# Patient Record
Sex: Female | Born: 1980 | Race: White | Hispanic: No | Marital: Married | Smoking: Current every day smoker
Health system: Southern US, Community
[De-identification: ages and names within clinical notes are randomized; demographics above are authoritative.]

## PROBLEM LIST (undated history)

## (undated) DIAGNOSIS — I10 Essential (primary) hypertension: Secondary | ICD-10-CM

## (undated) HISTORY — PX: WISDOM TOOTH EXTRACTION: SHX21

## (undated) HISTORY — PX: MANDIBLE FRACTURE SURGERY: SHX706

---
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
## 1999-04-20 ENCOUNTER — Other Ambulatory Visit: Admission: RE | Admit: 1999-04-20 | Discharge: 1999-04-20 | Payer: Self-pay | Admitting: *Deleted

## 2003-03-06 ENCOUNTER — Other Ambulatory Visit: Admission: RE | Admit: 2003-03-06 | Discharge: 2003-03-06 | Payer: Self-pay | Admitting: Family Medicine

## 2004-03-13 ENCOUNTER — Other Ambulatory Visit: Admission: RE | Admit: 2004-03-13 | Discharge: 2004-03-13 | Payer: Self-pay | Admitting: Family Medicine

## 2005-04-09 ENCOUNTER — Other Ambulatory Visit: Admission: RE | Admit: 2005-04-09 | Discharge: 2005-04-09 | Payer: Self-pay | Admitting: Family Medicine

## 2006-04-11 ENCOUNTER — Other Ambulatory Visit: Admission: RE | Admit: 2006-04-11 | Discharge: 2006-04-11 | Payer: Self-pay | Admitting: Family Medicine

## 2008-06-07 ENCOUNTER — Inpatient Hospital Stay (HOSPITAL_COMMUNITY): Admission: AD | Admit: 2008-06-07 | Discharge: 2008-06-10 | Payer: Self-pay | Admitting: Obstetrics and Gynecology

## 2008-06-08 ENCOUNTER — Encounter (INDEPENDENT_AMBULATORY_CARE_PROVIDER_SITE_OTHER): Payer: Self-pay | Admitting: Obstetrics and Gynecology

## 2008-11-25 ENCOUNTER — Ambulatory Visit (HOSPITAL_COMMUNITY): Admission: RE | Admit: 2008-11-25 | Discharge: 2008-11-25 | Payer: Self-pay | Admitting: Surgery

## 2010-05-07 LAB — APTT: aPTT: 30 seconds (ref 24–37)

## 2010-05-07 LAB — BASIC METABOLIC PANEL
BUN: 8 mg/dL (ref 6–23)
CO2: 22 mEq/L (ref 19–32)
Calcium: 9 mg/dL (ref 8.4–10.5)
Chloride: 106 mEq/L (ref 96–112)
Creatinine, Ser: 0.66 mg/dL (ref 0.4–1.2)
GFR calc Af Amer: >60 mL/min (ref >60)
Sodium: 137 mEq/L (ref 135–145)

## 2010-05-07 LAB — PROTIME-INR
INR: 0.89 (ref 0.00–1.49)
Prothrombin Time: 12 seconds (ref 11.6–15.2)

## 2010-05-07 LAB — CBC
Platelets: 338 10*3/uL (ref 150–400)
RBC: 4.22 MIL/uL (ref 3.87–5.11)
RDW: 13.4 % (ref 11.5–15.5)
WBC: 9.1 10*3/uL (ref 4.0–10.5)

## 2010-05-12 LAB — CBC
HCT: 30.1 % — ABNORMAL LOW (ref 36.0–46.0)
Hemoglobin: 10.7 g/dL — ABNORMAL LOW (ref 12.0–15.0)
MCV: 95.2 fL (ref 78.0–100.0)
Platelets: 266 10*3/uL (ref 150–400)
Platelets: 314 10*3/uL (ref 150–400)
RBC: 2.74 MIL/uL — ABNORMAL LOW (ref 3.87–5.11)
WBC: 15.1 10*3/uL — ABNORMAL HIGH (ref 4.0–10.5)
WBC: 20.3 10*3/uL — ABNORMAL HIGH (ref 4.0–10.5)

## 2010-05-12 LAB — CCBB MATERNAL DONOR DRAW

## 2010-08-05 IMAGING — XA IR ANGIO/EXT/UNI*R*
1 series · 16 of 24 positions shown · non-contrast
Comparison: none

CLINICAL DATA: 28-year-old female right thumb, index finger and
third finger ischemic changes, parasthesias and pain concerning for
ring knots phenomenon versus vasculitis or vascular disease.

[Series 1: run · 16 of 51 slices shown]
[im 1/51]
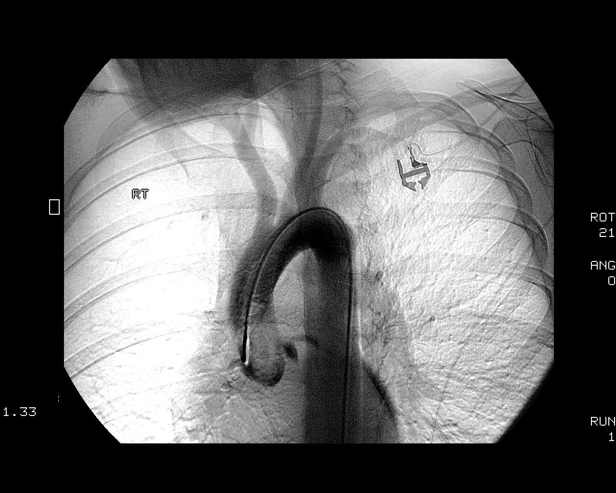
[im 5/51]
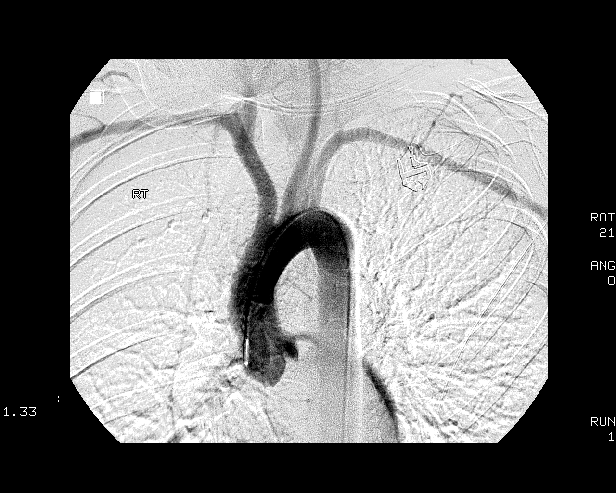
[im 7/51]
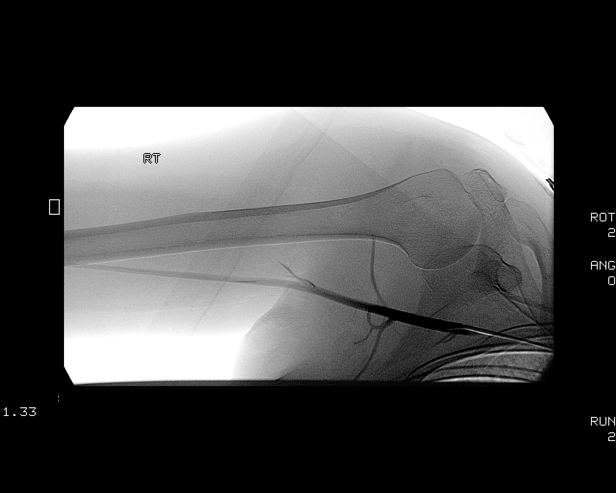
[im 11/51]
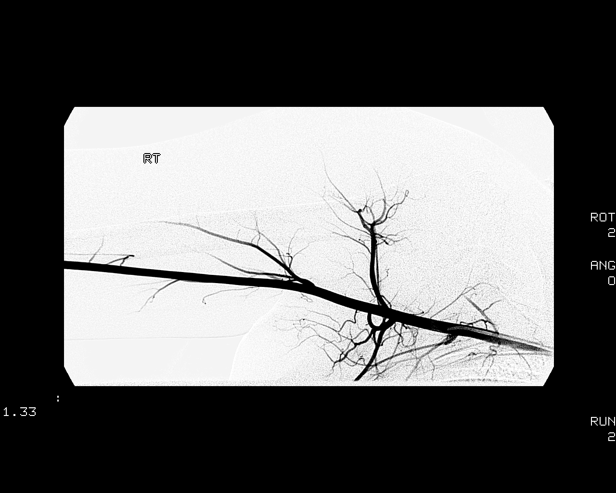
[im 14/51]
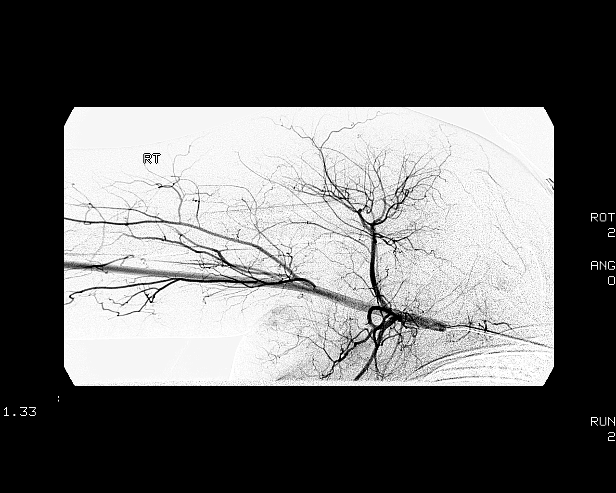
[im 18/51]
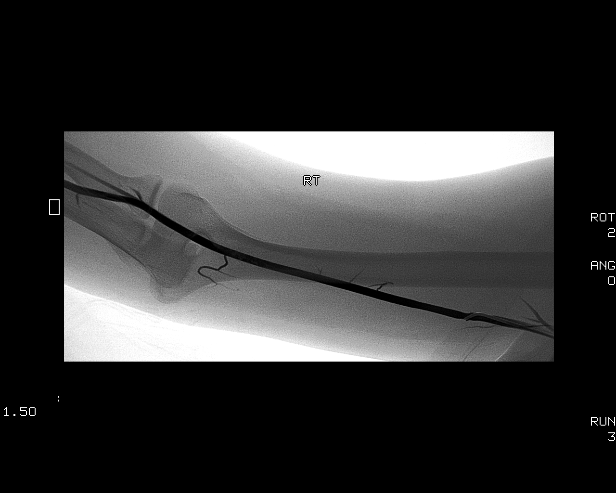
[im 20/51]
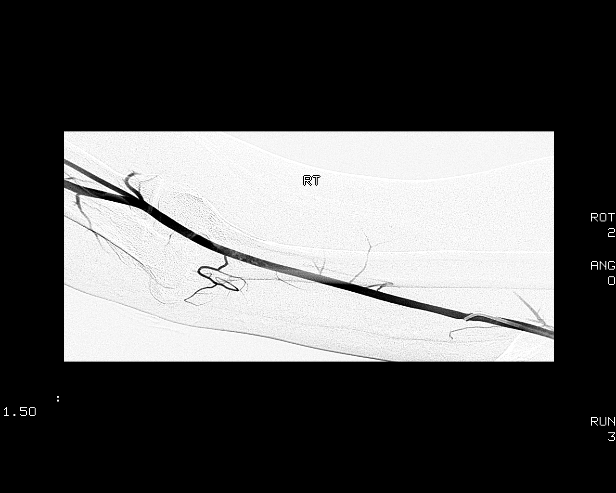
[im 24/51]
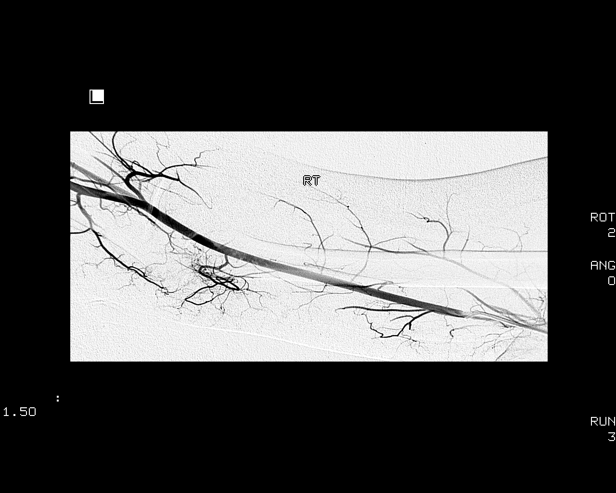
[im 27/51]
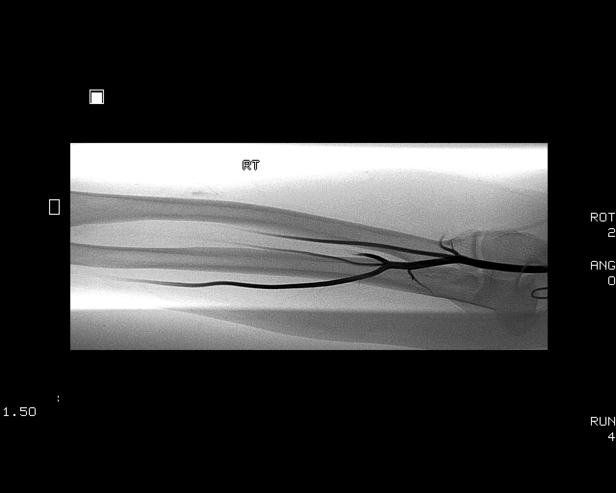
[im 31/51]
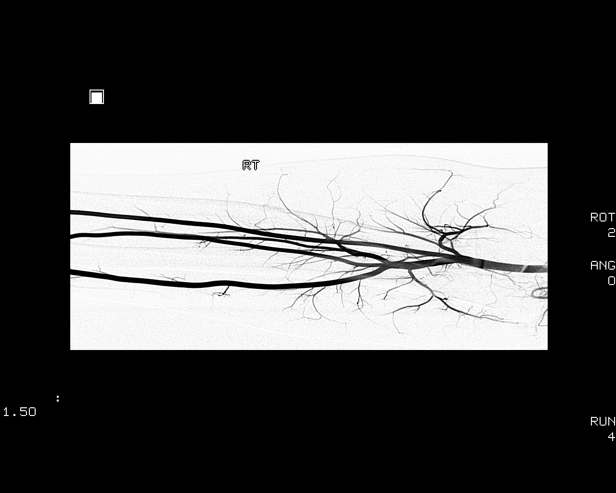
[im 33/51]
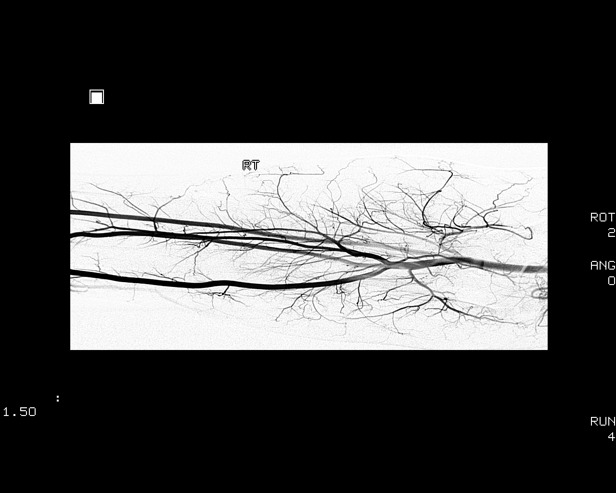
[im 37/51]
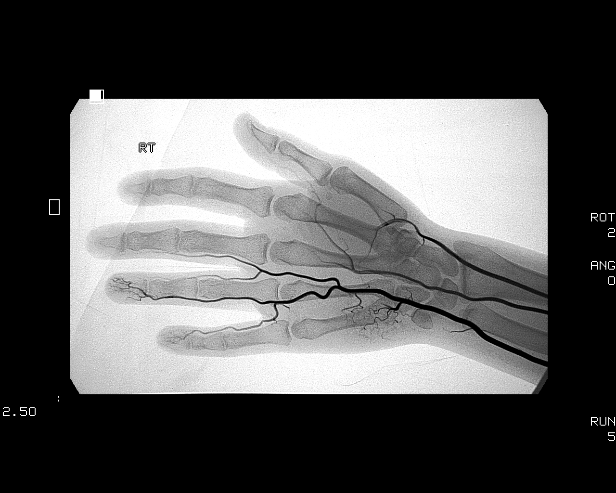
[im 40/51]
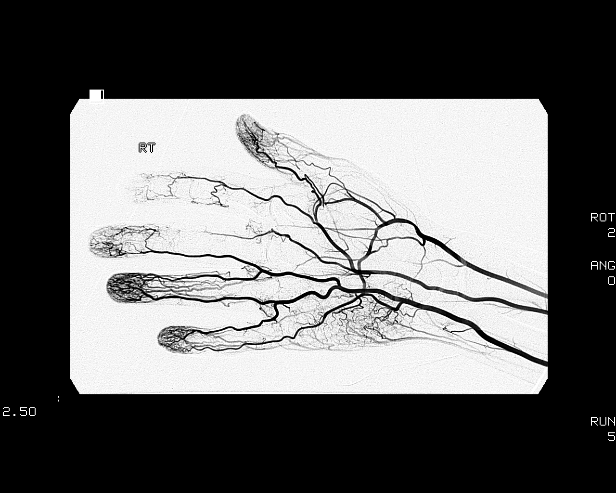
[im 44/51]
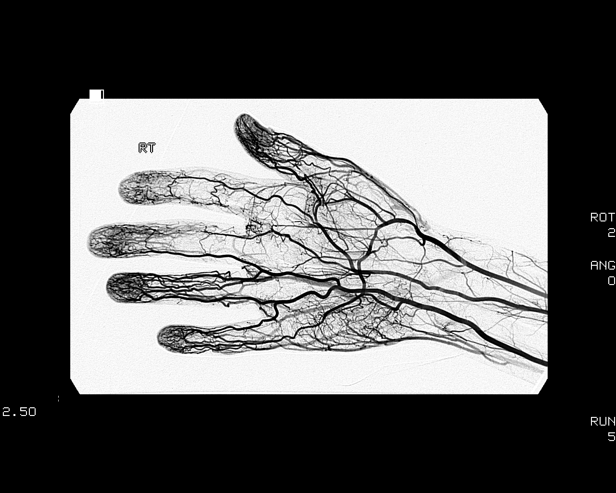
[im 46/51]
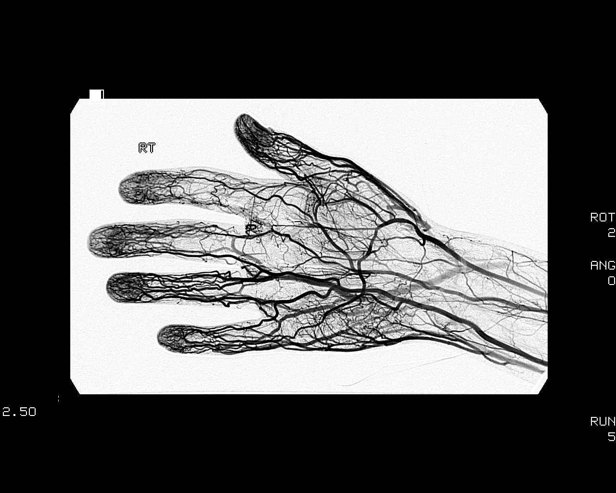
[im 51/51]
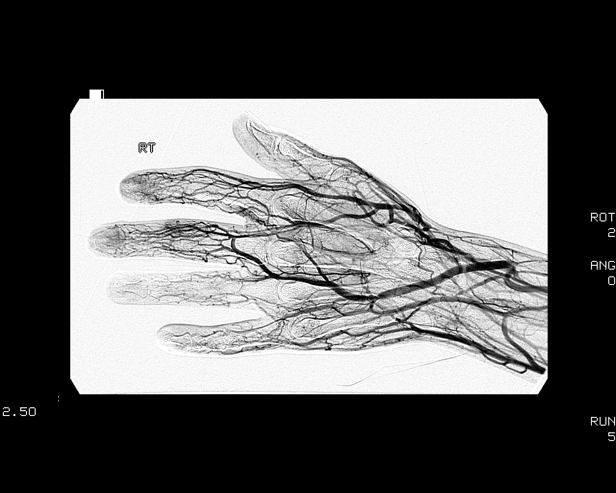

[16 of 24 positions shown; findings below may reference images not displayed]

ULTRASOUND GUIDANCE FOR VASCULAR ACCESS
CERVICOTHORACIC AORTIC ARCH
RIGHT UPPER EXTREMITY ANGIOGRAM

Radiologist:  Xherfi Salihasi, M.D.

Medications:  3.5 mg Versed, 100 mcg Fentanyl

Guidance:  Ultrasound and fluoroscopic

Fluoroscopy time:  4.4 minutes

Sedation time:  25 minutes

Contrast volume:  85 ml Visipaque 300

Complications:  No immediate

PROCEDURE/FINDINGS:

Informed consent was obtained from the patient following
explanation of the procedure, risks, benefits and alternatives.
The patient understands, agrees and consents for the procedure.
All questions were addressed.  A time out was performed.

Maximal barrier sterile technique utilized including caps, mask,
sterile gowns, sterile gloves, large sterile drape, hand hygiene,
and betadine.

Under sterile conditions and local anesthesia, right common femoral
artery access was performed with ultrasound.  Over a guide wire, a
5-French sheath was inserted.  Initially, a 5-French pigtail
catheter was advanced into the ascending aortic arch.  Contrast
injection was performed for a cervicothoracic aortogram.

Aortogram:  Thoracic aorta is normal in caliber.  No evidence of
atherosclerosis or wall irregularity.  No focal stenosis or source
of emboli identified.  The brachiocephalic, left common carotid,
and left subclavian arteries are all patent off of the aorta.

The pigtail catheter was exchanged for a 5-French catheter.  This
was advanced over a glide wire into the right axillary artery.
Right upper arm angiogram was performed.  For better visualization
of the peripheral small vessels, the catheter was advanced more
peripherally into the right brachial artery.  Additional image was
performed of the right upper extremity to include all five digits.

Right upper arm angiogram:  The right subclavian, axillary,
brachial, radial, interosseous, and ulnar arteries are all normal.
At the wrist, the radius, interosseous and ulnar arteries remain
patent supplying the superficial and deep palmar arches. The common
digital branches are patent to all five fingers.  However, there is
mild narrowing without occlusion of the common digital branches to
the right second and third digits.  The proper digital arteries to
the thumb, fourth and fifth fingers appear normal.  The right
second and third digits only have one proper digital artery each
instead of the normal two.   Between the right second and third
fingers, the common digital artery in this region is small in
caliber and it's branching proper digital arteries to the right
second and third fingers taper to chronic-appearing occlusions.
This results in slight delayed vascular flow to the right second
and third digit distal phalangeal vascular beds.  No evidence of
acute embolic disease.  There is normal venous drainage with
delayed imaging.

The vascular abnormalities described are nonspecific but can be
seen with small vessel digital arterial disease related to
connective tissue disorder or collagen vascular disease.  Other
less likely considerations would include mild early vasculitis or
Jim disease, however these two etiologies usually involve
more proximal larger vessels.
IMPRESSION: Mild narrowing without occlusion of the common digital branches to
the right second and third digits with only one patent proper
digital artery to the second and third digits.  The other proper
digital arteries to the second and third digits taper to chronic
occlusions as described.  Appearance is most consistent with
connective tissue disorder or collagen vascular disease.

## 2010-08-10 ENCOUNTER — Other Ambulatory Visit: Payer: Self-pay | Admitting: Obstetrics and Gynecology

## 2015-04-03 ENCOUNTER — Ambulatory Visit (INDEPENDENT_AMBULATORY_CARE_PROVIDER_SITE_OTHER): Payer: BLUE CROSS/BLUE SHIELD | Admitting: Physician Assistant

## 2015-04-03 VITALS — BP 114/84 | HR 105 | Temp 98.4°F | Resp 18 | Ht 63.0 in | Wt 164.0 lb

## 2015-04-03 DIAGNOSIS — Z720 Tobacco use: Secondary | ICD-10-CM

## 2015-04-03 DIAGNOSIS — F17200 Nicotine dependence, unspecified, uncomplicated: Secondary | ICD-10-CM

## 2015-04-03 DIAGNOSIS — J069 Acute upper respiratory infection, unspecified: Secondary | ICD-10-CM | POA: Diagnosis not present

## 2015-04-03 DIAGNOSIS — B9789 Other viral agents as the cause of diseases classified elsewhere: Principal | ICD-10-CM

## 2015-04-03 MED ORDER — PREDNISONE 20 MG PO TABS: ORAL_TABLET | ORAL | Status: DC

## 2015-04-03 MED ORDER — IPRATROPIUM BROMIDE 0.03 % NA SOLN: 2.0000 | Freq: Two times a day (BID) | NASAL | Status: DC

## 2015-04-03 MED ORDER — VARENICLINE TARTRATE 1 MG PO TABS: 1.0000 mg | ORAL_TABLET | Freq: Two times a day (BID) | ORAL | Status: DC

## 2015-04-03 MED ORDER — BENZONATATE 100 MG PO CAPS: 100.0000 mg | ORAL_CAPSULE | Freq: Three times a day (TID) | ORAL | Status: DC | PRN

## 2015-04-03 MED ORDER — VARENICLINE TARTRATE 0.5 MG X 11 & 1 MG X 42 PO MISC: ORAL | Status: DC

## 2015-04-03 NOTE — Patient Instructions (Addendum)
Upper Respiratory Infection, Adult Most upper respiratory infections (URIs) are a viral infection of the air passages leading to the lungs. A URI affects the nose, throat, and upper air passages. The most common type of URI is nasopharyngitis and is typically referred to as "the common cold." URIs run their course and usually go away on their own. Most of the time, a URI does not require medical attention, but sometimes a bacterial infection in the upper airways can follow a viral infection. This is called a secondary infection. Sinus and middle ear infections are common types of secondary upper respiratory infections. Bacterial pneumonia can also complicate a URI. A URI can worsen asthma and chronic obstructive pulmonary disease (COPD). Sometimes, these complications can require emergency medical care and may be life threatening.  CAUSES Almost all URIs are caused by viruses. A virus is a type of germ and can spread from one person to another.  RISKS FACTORS You may be at risk for a URI if:   You smoke.   You have chronic heart or lung disease.  You have a weakened defense (immune) system.   You are very young or very old.   You have nasal allergies or asthma.  You work in crowded or poorly ventilated areas.  You work in health care facilities or schools. SIGNS AND SYMPTOMS  Symptoms typically develop 2-3 days after you come in contact with a cold virus. Most viral URIs last 7-10 days. However, viral URIs from the influenza virus (flu virus) can last 14-18 days and are typically more severe. Symptoms may include:   Runny or stuffy (congested) nose.   Sneezing.   Cough.   Sore throat.   Headache.   Fatigue.   Fever.   Loss of appetite.   Pain in your forehead, behind your eyes, and over your cheekbones (sinus pain).  Muscle aches.  DIAGNOSIS  Your health care provider may diagnose a URI by:  Physical exam.  Tests to check that your symptoms are not due to  another condition such as:  Strep throat.  Sinusitis.  Pneumonia.  Asthma. TREATMENT  A URI goes away on its own with time. It cannot be cured with medicines, but medicines may be prescribed or recommended to relieve symptoms. Medicines may help:  Reduce your fever.  Reduce your cough.  Relieve nasal congestion. HOME CARE INSTRUCTIONS   Take medicines only as directed by your health care provider.   Gargle warm saltwater or take cough drops to comfort your throat as directed by your health care provider.  Use a warm mist humidifier or inhale steam from a shower to increase air moisture. This may make it easier to breathe.  Drink enough fluid to keep your urine clear or pale yellow.   Eat soups and other clear broths and maintain good nutrition.   Rest as needed.   Return to work when your temperature has returned to normal or as your health care provider advises. You may need to stay home longer to avoid infecting others. You can also use a face mask and careful hand washing to prevent spread of the virus.  Increase the usage of your inhaler if you have asthma.   Do not use any tobacco products, including cigarettes, chewing tobacco, or electronic cigarettes. If you need help quitting, ask your health care provider. PREVENTION  The best way to protect yourself from getting a cold is to practice good hygiene.   Avoid oral or hand contact with people with cold   symptoms.   Wash your hands often if contact occurs.  There is no clear evidence that vitamin C, vitamin E, echinacea, or exercise reduces the chance of developing a cold. However, it is always recommended to get plenty of rest, exercise, and practice good nutrition.  SEEK MEDICAL CARE IF:   You are getting worse rather than better.   Your symptoms are not controlled by medicine.   You have chills.  You have worsening shortness of breath.  You have brown or red mucus.  You have yellow or brown nasal  discharge.  You have pain in your face, especially when you bend forward.  You have a fever.  You have swollen neck glands.  You have pain while swallowing.  You have white areas in the back of your throat. SEEK IMMEDIATE MEDICAL CARE IF:   You have severe or persistent:  Headache.  Ear pain.  Sinus pain.  Chest pain.  You have chronic lung disease and any of the following:  Wheezing.  Prolonged cough.  Coughing up blood.  A change in your usual mucus.  You have a stiff neck.  You have changes in your:  Vision.  Hearing.  Thinking.  Mood. MAKE SURE YOU:   Understand these instructions.  Will watch your condition.  Will get help right away if you are not doing well or get worse.   This information is not intended to replace advice given to you by your health care provider. Make sure you discuss any questions you have with your health care provider.   Document Released: 07/14/2000 Document Revised: 06/04/2014 Document Reviewed: 04/25/2013 Elsevier Interactive Patient Education [employer].  Did you know that you begin to benefit from quitting smoking within the first twenty minutes? It's TRUE.  At 20 minutes: -blood pressure decreases -pulse rate drops -body temperature of hands and feet increases  At 8 hours: -carbon monoxide level in blood drops to normal -oxygen level in blood increases to normal  At 24 hours: -the chance of heart attack decreases  At 48 hours: -nerve endings start regrowing -ability to smell and taste is enhanced  2 weeks-3 months: -circulation improves -walking becomes easier -lung function improves  1-9 months: -coughing, sinus congestion, fatigue and shortness of breath decreases  1 year: -excess risk of heart disease is decreased to HALF that of a smoker  5 years: Stroke risk is reduced to that of people who have never smoked  10 years: -risk of lung cancer drops to as little as half that of  continuing smokers -risk of cancer of the mouth, throat, esophagus, bladder, kidney and pancreas decreases -risk of ulcer decreases  15 years -risk of heart disease is now similar to that of people who have never smoked -risk of death returns to nearly the level of people who have never smoked

## 2015-04-03 NOTE — Progress Notes (Signed)
Subjective:   Patient ID: Summer King, female     DOB: 09-18-1980, 35 y.o.    MRN: [id number]  PCP: No primary care provider on file.  Chief Complaint  Patient presents with  . Sore Throat    x 2 days  . Nasal Congestion  . Cough    HPI  Presents for evaluation of cough. "I can't stop coughing."  Began with a sore throat 2 days ago. Yesterday developed a cough. Not productive, but coughs to the point of gagging. No better with OTC cough preparation. Last night developed nasal congestion, neck soreness, ear pain. Does NOT feel like strep she's had previously. No fever, chills. No GU, nausea, diarrhea. No muscle/joint aches.  Prior to Admission medications   Medication Sig Start Date End Date Taking? Authorizing Provider  levonorgestrel (MIRENA) 20 MCG/24HR IUD 1 each by Intrauterine route once.   Yes Historical Provider, MD     Allergies  Allergen Reactions  . Codeine Hives  . Hydrocodone Hives  . Penicillins      There are no active problems to display for this patient.    Family History  Problem Relation Age of Onset  . Heart disease Father   . Arthritis Mother     rheumatoid     Social History   Social History  . Marital Status: Married    Spouse Name: N/A  . Number of Children: 1  . Years of Education: 12+   Occupational History  . computer/office work    Social History Main Topics  . Smoking status: Current Every Day Smoker  . Smokeless tobacco: Never Used     Comment: contemplating, interested in Chantix (04/2015)  . Alcohol Use: 1.2 - 1.8 oz/week    2-3 Standard drinks or equivalent per week  . Drug Use: No  . Sexual Activity: Not on file   Other Topics Concern  . Not on file   Social History Narrative   Lives with her husband and son.        Review of Systems  Constitutional: Positive for fatigue. Negative for fever, chills, activity change, appetite change and unexpected weight change.  HENT: Positive for congestion,  ear pain, postnasal drip, rhinorrhea, sinus pressure, sore throat and voice change. Negative for dental problem, facial swelling, hearing loss, mouth sores, sneezing, tinnitus and trouble swallowing.   Eyes: Negative for photophobia, pain, redness and visual disturbance.  Respiratory: Positive for cough. Negative for chest tightness, shortness of breath and wheezing.   Cardiovascular: Negative for chest pain, palpitations and leg swelling.  Gastrointestinal: Positive for vomiting (with hard coughing). Negative for nausea, abdominal pain, diarrhea, constipation and blood in stool.  Genitourinary: Negative for dysuria, urgency, frequency and hematuria.  Musculoskeletal: Negative for myalgias, arthralgias, gait problem and neck stiffness.  Skin: Negative for rash.  Neurological: Negative for dizziness, speech difficulty, weakness, light-headedness, numbness and headaches.  Hematological: Negative for adenopathy.  Psychiatric/Behavioral: Negative for confusion and sleep disturbance. The patient is not nervous/anxious.          Objective:  Physical Exam  Constitutional: She is oriented to person, place, and time. She appears well-developed and well-nourished. She is active and cooperative. No distress.  BP 114/84 mmHg  Pulse 105  Temp(Src) 98.4 F (36.9 C)  Resp 18  Ht 5\' 3"  (1.6 m)  Wt 164 lb (74.39 kg)  BMI 29.06 kg/m2  SpO2 98%   HENT:  Head: Normocephalic and atraumatic.  Right Ear: Hearing, tympanic membrane, external ear and ear  canal normal.  Left Ear: Hearing, tympanic membrane, external ear and ear canal normal.  Nose: Mucosal edema present. No rhinorrhea.  No foreign bodies. Right sinus exhibits no maxillary sinus tenderness and no frontal sinus tenderness. Left sinus exhibits no maxillary sinus tenderness and no frontal sinus tenderness.  Mouth/Throat: Uvula is midline, oropharynx is clear and moist and mucous membranes are normal. No uvula swelling. No oropharyngeal exudate.    Eyes: Conjunctivae and EOM are normal. Pupils are equal, round, and reactive to light. Right eye exhibits no discharge. Left eye exhibits no discharge. No scleral icterus.  Neck: Trachea normal, normal range of motion and full passive range of motion without pain. Neck supple. No thyroid mass and no thyromegaly present.  Cardiovascular: Normal rate, regular rhythm and normal heart sounds.   Pulmonary/Chest: Effort normal and breath sounds normal.  Lymphadenopathy:       Head (right side): No submandibular, no tonsillar, no preauricular, no posterior auricular and no occipital adenopathy present.       Head (left side): No submandibular, no tonsillar, no preauricular and no occipital adenopathy present.    She has no cervical adenopathy.       Right: No supraclavicular adenopathy present.       Left: No supraclavicular adenopathy present.  Neurological: She is alert and oriented to person, place, and time. She has normal strength. No cranial nerve deficit or sensory deficit.  Skin: Skin is warm, dry and intact. No rash noted.  Psychiatric: She has a normal mood and affect. Her speech is normal and behavior is normal.             Assessment & Plan:  1. Viral URI with cough Unable to tolerate codeine/hydrocodone due to itching/hives. No wheezing. Prednisone. Atrovent NS. Tessalon. Rest. Fluids.  - predniSONE (DELTASONE) 20 MG tablet; Take 3 PO QAM x3days, 2 PO QAM x3days, 1 PO QAM x3days  Dispense: 18 tablet; Refill: 0 - ipratropium (ATROVENT) 0.03 % nasal spray; Place 2 sprays into both nostrils 2 (two) times daily.  Dispense: 30 mL; Refill: 0 - benzonatate (TESSALON) 100 MG capsule; Take 1-2 capsules (100-200 mg total) by mouth 3 (three) times daily as needed for cough.  Dispense: 40 capsule; Refill: 0  2. Smoker Trial of Chantix. Anticipatory guidance. - varenicline (CHANTIX) 1 MG tablet; Take 1 tablet (1 mg total) by mouth 2 (two) times daily. To begin: 1/2 tablet once daily x 4  days, then 1/2 tablet twice daily x 4 days, then 1 tablet each evening and 1/2 tablet each morning x 4 days, then 1 tablet twice daily.  Dispense: 60 tablet; Refill: 5   Fara Chute, PA-C Physician Assistant-Certified [facility]

## 2017-02-04 ENCOUNTER — Encounter: Payer: Self-pay | Admitting: Oncology

## 2021-07-01 ENCOUNTER — Telehealth: Payer: Self-pay | Admitting: Hematology and Oncology

## 2021-07-01 NOTE — Telephone Encounter (Signed)
Scheduled appt per 5/31 referral. Earlier appt time was offered to pt, she was unable to make day/time that was originally offered. Pt is aware of appt date and time. Pt is aware to arrive 15 mins prior to appt time and to bring and updated insurance card. Pt is aware of appt location.

## 2021-07-17 ENCOUNTER — Encounter: Payer: Self-pay | Admitting: Hematology and Oncology

## 2021-07-17 ENCOUNTER — Inpatient Hospital Stay: Payer: 59

## 2021-07-17 ENCOUNTER — Other Ambulatory Visit: Payer: Self-pay

## 2021-07-17 ENCOUNTER — Inpatient Hospital Stay: Payer: 59 | Attending: Hematology and Oncology | Admitting: Hematology and Oncology

## 2021-07-17 VITALS — BP 164/120 | HR 100 | Resp 18

## 2021-07-17 DIAGNOSIS — R030 Elevated blood-pressure reading, without diagnosis of hypertension: Secondary | ICD-10-CM | POA: Diagnosis not present

## 2021-07-17 DIAGNOSIS — D751 Secondary polycythemia: Secondary | ICD-10-CM

## 2021-07-17 DIAGNOSIS — F17210 Nicotine dependence, cigarettes, uncomplicated: Secondary | ICD-10-CM | POA: Insufficient documentation

## 2021-07-17 DIAGNOSIS — Z720 Tobacco use: Secondary | ICD-10-CM

## 2021-07-17 DIAGNOSIS — E860 Dehydration: Secondary | ICD-10-CM | POA: Diagnosis not present

## 2021-07-17 NOTE — Progress Notes (Signed)
Summer King presents today for phlebotomy per MD orders. Phlebotomy procedure started at 1400 and ended at 1412. 500 grams removed via 16 G needle to [place]. IV needle removed intact. Patient observed for 30 minutes after procedure without any incident. Patient provided with food and drink following procedure.  Patient tolerated procedure well. Patient BP still elevated at discharge. Dr. Alvy Bimler made aware. Per MD - okay to discharge at this time. Patient endorses that she feels fine. No symptoms of headache, blurred vision, chest pain, or shortness of breath at this time.

## 2021-07-17 NOTE — Patient Instructions (Signed)

## 2021-07-18 ENCOUNTER — Encounter: Payer: Self-pay | Admitting: Hematology and Oncology

## 2021-07-18 DIAGNOSIS — E860 Dehydration: Secondary | ICD-10-CM | POA: Insufficient documentation

## 2021-07-18 DIAGNOSIS — Z720 Tobacco use: Secondary | ICD-10-CM | POA: Insufficient documentation

## 2021-07-18 DIAGNOSIS — R030 Elevated blood-pressure reading, without diagnosis of hypertension: Secondary | ICD-10-CM | POA: Insufficient documentation

## 2021-07-18 NOTE — Progress Notes (Signed)
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 I recommend phlebotomy immediately due to high risk of thrombosis The patient is concerned about cost of treatment She is not willing to come back due to concern for cost Ultimately, she agreed for 1 unit of blood to be removed today I recommend the patient to go and donate blood 3 more times every 2 to 3 weeks to get her hemoglobin down to target goal of around 16 She would need to quit smoking as well as this is caused by her smoking Due to concern about medical costs, she would get her CBC rechecked with her primary care doctor in a few months for further follow-up I also recommend aspirin daily to prevent risk of blood clots  Dehydration She is clinically dehydrated We discussed importance of drinking at least 64 ounces of fluid a day She will try her best  Tobacco abuse She was given information by her referring physician about smoking cessation She is motivated to quit We discussed importance of her quitting due to high risk of thrombosis  Elevated BP without diagnosis of hypertension Her high blood pressure is likely due to high viscosity due to polycythemia It is likely that her blood pressure will improve back to normal with for phlebotomy/donation sessions I recommend the patient to continue follow-up with her primary care doctor in regards to chronic blood pressure monitoring  The total time spent in the appointment was 55 minutes encounter with patients including review of chart and various tests results, discussions about plan of care and coordination of care plan   All questions were answered. The patient knows to call the clinic with any problems, questions or concerns. No barriers to learning was detected.  Heath Lark,  MD 6/17/202310:59 AM  CHIEF COMPLAINTS/PURPOSE OF CONSULTATION:  Erythrocytosis  HISTORY OF PRESENTING ILLNESS:  Summer King 41 y.o. female is here because of elevated hemoglobin.  She was found to have abnormal CBC from recent blood draw. Her baseline hemoglobin/CBC on November 25, 2008 was normal at 13.5 Most recently, she went to her gynecologist for evaluation and routine Pap smear Most recently, on Jun 30, 2021, repeat hemoglobin came back high at 20.1 with hematocrit of 60.1.  This was repeated and confirmed She denies intermittent headaches, shortness of breath on exertion, frequent leg cramps and occasional chest pain.  She never suffer from diagnosis of blood clot.  There is no prior diagnosis of obstructive sleep apnea. The patient denies weight loss or skin itching. The patient is a smoker and currently smokes 1 pack of cigarettes per day for the last 20 years. She also admits that she is not drinking enough liquid She drinks somewhere around 2 to 3 cups of water per day  MEDICAL HISTORY:  History reviewed. No pertinent past medical history.  SURGICAL HISTORY: Past Surgical History:  Procedure Laterality Date   MANDIBLE FRACTURE SURGERY  4th grade    SOCIAL HISTORY: Social History   Socioeconomic History   Marital status: Married    Spouse name: Not on file   Number of children: 1   Years of education: 12+   Highest education level: Not on file  Occupational History   Occupation: computer/office work  Tobacco Use   Smoking status: Every Day   Smokeless tobacco: Never   Tobacco comments:    contemplating,  interested in Chantix (04/2015)  Substance and Sexual Activity   Alcohol use: Yes    Alcohol/week: 2.0 - 3.0 standard drinks of alcohol    Types: 2 - 3 Standard drinks or equivalent per week   Drug use: No   Sexual activity: Not on file  Other Topics Concern   Not on file  Social History Narrative   Lives with her husband and son.   Social  Determinants of Health   Financial Resource Strain: Not on file  Food Insecurity: Not on file  Transportation Needs: Not on file  Physical Activity: Not on file  Stress: Not on file  Social Connections: Not on file  Intimate Partner Violence: Not on file    FAMILY HISTORY: Family History  Problem Relation Age of Onset   Heart disease Father    Arthritis Mother        rheumatoid    ALLERGIES:  is allergic to codeine, hydrocodone, and penicillins.  MEDICATIONS:  Current Outpatient Medications  Medication Sig Dispense Refill   levonorgestrel (MIRENA) 20 MCG/24HR IUD 1 each by Intrauterine route once.     No current facility-administered medications for this visit.    REVIEW OF SYSTEMS:   Constitutional: Denies fevers, chills or abnormal night sweats Eyes: Denies blurriness of vision, double vision or watery eyes Ears, nose, mouth, throat, and face: Denies mucositis or sore throat Respiratory: Denies cough, dyspnea or wheezes Cardiovascular: Denies palpitation, chest discomfort or lower extremity swelling Gastrointestinal:  Denies nausea, heartburn or change in bowel habits Skin: Denies abnormal skin rashes Lymphatics: Denies new lymphadenopathy or easy bruising Neurological:Denies numbness, tingling or new weaknesses Behavioral/Psych: Mood is stable, no new changes  All other systems were reviewed with the patient and are negative.  PHYSICAL EXAMINATION: ECOG PERFORMANCE STATUS: 1 - Symptomatic but completely ambulatory  Vitals:   07/17/21 1319  BP: (!) 164/111  Pulse: 84  Resp: 18  Temp: 98.9 F (37.2 C)  SpO2: 96%   Filed Weights   07/17/21 1319  Weight: 165 lb (74.8 kg)    GENERAL:alert, no distress and comfortable.  She looks plethoric SKIN: skin color, texture, turgor are normal, no rashes or significant lesions EYES: normal, conjunctiva are pink and non-injected, sclera clear OROPHARYNX:no exudate, no erythema and lips, buccal mucosa, and tongue normal   NECK: supple, thyroid normal size, non-tender, without nodularity LYMPH:  no palpable lymphadenopathy in the cervical, axillary or inguinal LUNGS: clear to auscultation and percussion with normal breathing effort HEART: regular rate & rhythm and no murmurs and no lower extremity edema ABDOMEN:abdomen soft, non-tender and normal bowel sounds Musculoskeletal:no cyanosis of digits and no clubbing  PSYCH: alert & oriented x 3 with fluent speech NEURO: no focal motor/sensory deficits  LABORATORY DATA:  I have reviewed the data as listed in referral packet

## 2021-07-18 NOTE — Assessment & Plan Note (Signed)
She was given information by her referring physician about smoking cessation She is motivated to quit We discussed importance of her quitting due to high risk of thrombosis

## 2021-07-18 NOTE — Assessment & Plan Note (Signed)
Her high blood pressure is likely due to high viscosity due to polycythemia It is likely that her blood pressure will improve back to normal with for phlebotomy/donation sessions I recommend the patient to continue follow-up with her primary care doctor in regards to chronic blood pressure monitoring 

## 2021-07-18 NOTE — Assessment & Plan Note (Addendum)
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 I recommend phlebotomy immediately due to high risk of thrombosis The patient is concerned about cost of treatment She is not willing to come back due to concern for cost Ultimately, she agreed for 1 unit of blood to be removed today I recommend the patient to go and donate blood 3 more times every 2 to 3 weeks to get her hemoglobin down to target goal of around 16 She would need to quit smoking as well as this is caused by her smoking Due to concern about medical costs, she would get her CBC rechecked with her primary care doctor in a few months for further follow-up I also recommend aspirin daily to prevent risk of blood clots

## 2021-07-18 NOTE — Assessment & Plan Note (Signed)
She is clinically dehydrated We discussed importance of drinking at least 64 ounces of fluid a day She will try her best

## 2021-08-25 ENCOUNTER — Telehealth: Payer: Self-pay

## 2021-08-25 ENCOUNTER — Other Ambulatory Visit: Payer: Self-pay | Admitting: Hematology and Oncology

## 2021-08-25 NOTE — Telephone Encounter (Signed)
I will send LOS 

## 2021-08-25 NOTE — Telephone Encounter (Signed)
Returned her call. She is requesting appt for labs and phlebotomy. [facility] and was told her HGB was too high. Today she went to [facility] and was told her bp is too high.

## 2021-08-27 ENCOUNTER — Telehealth: Payer: Self-pay | Admitting: Hematology and Oncology

## 2021-08-27 NOTE — Telephone Encounter (Signed)
.  Called patient to schedule appointment per 7/26 inbasket, patient is aware of date and time.   

## 2021-08-28 ENCOUNTER — Encounter: Payer: Self-pay | Admitting: Hematology and Oncology

## 2021-08-28 ENCOUNTER — Inpatient Hospital Stay: Payer: 59

## 2021-08-28 ENCOUNTER — Other Ambulatory Visit: Payer: Self-pay | Admitting: Hematology and Oncology

## 2021-08-28 ENCOUNTER — Inpatient Hospital Stay: Payer: 59 | Attending: Hematology and Oncology | Admitting: Hematology and Oncology

## 2021-08-28 ENCOUNTER — Other Ambulatory Visit: Payer: Self-pay

## 2021-08-28 DIAGNOSIS — D751 Secondary polycythemia: Secondary | ICD-10-CM

## 2021-08-28 DIAGNOSIS — Z720 Tobacco use: Secondary | ICD-10-CM

## 2021-08-28 DIAGNOSIS — R030 Elevated blood-pressure reading, without diagnosis of hypertension: Secondary | ICD-10-CM | POA: Diagnosis not present

## 2021-08-28 LAB — CBC WITH DIFFERENTIAL/PLATELET
Abs Immature Granulocytes: 0.02 10*3/uL (ref 0.00–0.07)
Basophils Absolute: 0.1 10*3/uL (ref 0.0–0.1)
Basophils Relative: 1 %
Eosinophils Absolute: 0.2 10*3/uL (ref 0.0–0.5)
Eosinophils Relative: 2 %
HCT: 55.2 % — ABNORMAL HIGH (ref 36.0–46.0)
Hemoglobin: 20.5 g/dL — ABNORMAL HIGH (ref 12.0–15.0)
Immature Granulocytes: 0 %
Lymphocytes Relative: 15 %
Lymphs Abs: 1.2 10*3/uL (ref 0.7–4.0)
MCH: 37.8 pg — ABNORMAL HIGH (ref 26.0–34.0)
MCHC: 37.1 g/dL — ABNORMAL HIGH (ref 30.0–36.0)
MCV: 101.7 fL — ABNORMAL HIGH (ref 80.0–100.0)
Monocytes Absolute: 0.7 10*3/uL (ref 0.1–1.0)
Monocytes Relative: 9 %
Neutro Abs: 5.8 10*3/uL (ref 1.7–7.7)
Neutrophils Relative %: 73 %
Platelets: 223 10*3/uL (ref 150–400)
RBC: 5.43 MIL/uL — ABNORMAL HIGH (ref 3.87–5.11)
RDW: 13.9 % (ref 11.5–15.5)
WBC: 8 10*3/uL (ref 4.0–10.5)
nRBC: 0 % (ref 0.0–0.2)

## 2021-08-28 NOTE — Progress Notes (Signed)
 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 I recommend phlebotomy immediately due to high risk of thrombosis She waited too long recently to [facility] and when they found her hemoglobin was over 20, she was denied ability to donate blood For now, I recommend she return here weekly to get a unit of blood removed until she reached hemoglobin around 16 before she should show her to donate blood She would need to quit smoking as well as this is caused by her smoking I also recommend aspirin daily to prevent risk of blood clots  Elevated BP without diagnosis of hypertension Her high blood pressure is likely due to high viscosity due to polycythemia It is likely that her blood pressure will improve back to normal with for phlebotomy/donation sessions I recommend the patient to continue follow-up with her primary care doctor in regards to chronic blood pressure monitoring  Tobacco abuse She is contemplating switching over to vaping I discouraged her that due to high risk of lung injury associated with vaping We discussed importance of nicotine cessation  No orders of the defined types were placed in this encounter.   The total time spent in the appointment was 20 minutes encounter with patients including review of chart and various tests results, discussions about plan of care and coordination of care plan   All questions were answered. The patient knows to call the clinic with any problems, questions or concerns. No barriers to learning was detected.    Summer Gorsuch, MD 7/28/202310:22 AM  INTERVAL HISTORY: Summer King 41 y.o. female returns for urgent evaluation due to severe secondary polycythemia She waited too long to donate blood and when she was refused blood donation due to high hemoglobin Unfortunately, she is  not able to quit smoking yet  SUMMARY OF HEMATOLOGIC HISTORY: Summer King 41 y.o. female is here because of elevated hemoglobin.  She was found to have abnormal CBC from recent blood draw. Her baseline hemoglobin/CBC on November 25, 2008 was normal at 13.5 Most recently, she went to her gynecologist for evaluation and routine Pap smear Most recently, on Jun 30, 2021, repeat hemoglobin came back high at 20.1 with hematocrit of 60.1.  This was repeated and confirmed She denies intermittent headaches, shortness of breath on exertion, frequent leg cramps and occasional chest pain.  She never suffer from diagnosis of blood clot.  There is no prior diagnosis of obstructive sleep apnea. The patient denies weight loss or skin itching. The patient is a smoker and currently smokes 1 pack of cigarettes per day for the last 20 years. She also admits that she is not drinking enough liquid She drinks somewhere around 2 to 3 cups of water per day On July 17, 2021, a unit of blood was removed  I have reviewed the past medical history, past surgical history, social history and family history with the patient and they are unchanged from previous note.  ALLERGIES:  is allergic to codeine, hydrocodone, and penicillins.  MEDICATIONS:  Current Outpatient Medications  Medication Sig Dispense Refill   levonorgestrel (MIRENA) 20 MCG/24HR IUD 1 each by Intrauterine route once.     No current facility-administered medications for this visit.     REVIEW OF SYSTEMS:   Constitutional: Denies fevers, chills or night sweats Eyes: Denies blurriness of vision Ears, nose, mouth, throat, and face: Denies   mucositis or sore throat Respiratory: Denies cough, dyspnea or wheezes Cardiovascular: Denies palpitation, chest discomfort or lower extremity swelling Gastrointestinal:  Denies nausea, heartburn or change in bowel habits Skin: Denies abnormal skin rashes Lymphatics: Denies new lymphadenopathy or easy  bruising Neurological:Denies numbness, tingling or new weaknesses Behavioral/Psych: Mood is stable, no new changes  All other systems were reviewed with the patient and are negative.  PHYSICAL EXAMINATION: ECOG PERFORMANCE STATUS: 0 - Asymptomatic  Vitals:   08/28/21 0948  BP: (!) 162/107  Pulse: (!) 101  Resp: 18  SpO2: 96%   Filed Weights   08/28/21 0948  Weight: 164 lb 3.2 oz (74.5 kg)    GENERAL:alert, no distress and comfortable.  She looks plethoric  NEURO: alert & oriented x 3 with fluent speech, no focal motor/sensory deficits  LABORATORY DATA:  I have reviewed the data as listed     Component Value Date/Time   NA 137 11/25/2008 0753   K 3.9 11/25/2008 0753   CL 106 11/25/2008 0753   CO2 22 11/25/2008 0753   GLUCOSE 95 11/25/2008 0753   BUN 8 11/25/2008 0753   CREATININE 0.66 11/25/2008 0753   CALCIUM 9.0 11/25/2008 0753   GFRNONAA >60 11/25/2008 0753   GFRAA  11/25/2008 0753    >60        The eGFR has been calculated using the MDRD equation. This calculation has not been validated in all clinical situations. eGFR's persistently <60 mL/min signify possible Chronic Kidney Disease.    No results found for: "SPEP", "UPEP"  Lab Results  Component Value Date   WBC 8.0 08/28/2021   NEUTROABS 5.8 08/28/2021   HGB 20.5 (H) 08/28/2021   HCT 55.2 (H) 08/28/2021   MCV 101.7 (H) 08/28/2021   PLT 223 08/28/2021      Chemistry      Component Value Date/Time   NA 137 11/25/2008 0753   K 3.9 11/25/2008 0753   CL 106 11/25/2008 0753   CO2 22 11/25/2008 0753   BUN 8 11/25/2008 0753   CREATININE 0.66 11/25/2008 0753      Component Value Date/Time   CALCIUM 9.0 11/25/2008 0753     

## 2021-08-28 NOTE — Assessment & Plan Note (Signed)
She is contemplating switching over to vaping I discouraged her that due to high risk of lung injury associated with vaping We discussed importance of nicotine cessation

## 2021-08-28 NOTE — Progress Notes (Signed)
Patient tolerated phlebotomy and remained 15 minutes post observation and requested to leave. She stated that she felt fine. Understands to drink plenty of fluids especially with the heat. Patient ambulated to the lobby independently.

## 2021-08-28 NOTE — Assessment & Plan Note (Signed)
Her high blood pressure is likely due to high viscosity due to polycythemia It is likely that her blood pressure will improve back to normal with for phlebotomy/donation sessions I recommend the patient to continue follow-up with her primary care doctor in regards to chronic blood pressure monitoring

## 2021-08-28 NOTE — Assessment & Plan Note (Signed)
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

## 2021-09-04 ENCOUNTER — Encounter: Payer: Self-pay | Admitting: Hematology and Oncology

## 2021-09-04 ENCOUNTER — Other Ambulatory Visit: Payer: Self-pay

## 2021-09-04 ENCOUNTER — Inpatient Hospital Stay: Payer: 59 | Attending: Hematology and Oncology

## 2021-09-04 ENCOUNTER — Inpatient Hospital Stay: Payer: 59

## 2021-09-04 VITALS — BP 167/106 | HR 97 | Temp 98.8°F | Resp 18

## 2021-09-04 DIAGNOSIS — D751 Secondary polycythemia: Secondary | ICD-10-CM

## 2021-09-04 LAB — CBC WITH DIFFERENTIAL/PLATELET
Abs Immature Granulocytes: 0.02 10*3/uL (ref 0.00–0.07)
Basophils Absolute: 0.1 10*3/uL (ref 0.0–0.1)
Basophils Relative: 1 %
Eosinophils Absolute: 0.2 10*3/uL (ref 0.0–0.5)
Eosinophils Relative: 2 %
HCT: 51.1 % — ABNORMAL HIGH (ref 36.0–46.0)
Hemoglobin: 18.4 g/dL — ABNORMAL HIGH (ref 12.0–15.0)
Immature Granulocytes: 0 %
Lymphocytes Relative: 20 %
Lymphs Abs: 1.8 10*3/uL (ref 0.7–4.0)
MCH: 37.5 pg — ABNORMAL HIGH (ref 26.0–34.0)
MCHC: 36 g/dL (ref 30.0–36.0)
MCV: 104.1 fL — ABNORMAL HIGH (ref 80.0–100.0)
Monocytes Absolute: 0.7 10*3/uL (ref 0.1–1.0)
Monocytes Relative: 8 %
Neutro Abs: 6.1 10*3/uL (ref 1.7–7.7)
Neutrophils Relative %: 69 %
Platelets: 247 10*3/uL (ref 150–400)
RBC: 4.91 MIL/uL (ref 3.87–5.11)
RDW: 14 % (ref 11.5–15.5)
WBC: 8.9 10*3/uL (ref 4.0–10.5)
nRBC: 0 % (ref 0.0–0.2)

## 2021-09-04 NOTE — Patient Instructions (Signed)

## 2021-09-04 NOTE — Progress Notes (Signed)
Summer King presents today for phlebotomy per MD orders. Phlebotomy procedure started at 1531 and ended at 1537. A 16G Phlebotomy kit to the RAC was used. 549 grams removed. Patient declined to be observed for 30 minutes after procedure. Patient tolerated procedure well without incident. RN provided Patient sprite prior to procedure.  IV needle removed intact. VSS at discharge.  Patient ambulatory to lobby in stable condition.

## 2021-09-11 ENCOUNTER — Inpatient Hospital Stay: Payer: 59

## 2021-09-11 ENCOUNTER — Other Ambulatory Visit: Payer: Self-pay

## 2021-09-11 VITALS — BP 152/68 | HR 98 | Temp 98.2°F | Resp 18

## 2021-09-11 DIAGNOSIS — D751 Secondary polycythemia: Secondary | ICD-10-CM | POA: Diagnosis not present

## 2021-09-11 LAB — CBC WITH DIFFERENTIAL/PLATELET
Abs Immature Granulocytes: 0.04 10*3/uL (ref 0.00–0.07)
Basophils Absolute: 0.1 10*3/uL (ref 0.0–0.1)
Basophils Relative: 1 %
Eosinophils Absolute: 0.2 10*3/uL (ref 0.0–0.5)
Eosinophils Relative: 2 %
HCT: 48.9 % — ABNORMAL HIGH (ref 36.0–46.0)
Hemoglobin: 17.7 g/dL — ABNORMAL HIGH (ref 12.0–15.0)
Immature Granulocytes: 0 %
Lymphocytes Relative: 18 %
Lymphs Abs: 2 10*3/uL (ref 0.7–4.0)
MCH: 37.1 pg — ABNORMAL HIGH (ref 26.0–34.0)
MCHC: 36.2 g/dL — ABNORMAL HIGH (ref 30.0–36.0)
MCV: 102.5 fL — ABNORMAL HIGH (ref 80.0–100.0)
Monocytes Absolute: 0.8 10*3/uL (ref 0.1–1.0)
Monocytes Relative: 8 %
Neutro Abs: 7.7 10*3/uL (ref 1.7–7.7)
Neutrophils Relative %: 71 %
Platelets: 313 10*3/uL (ref 150–400)
RBC: 4.77 MIL/uL (ref 3.87–5.11)
RDW: 13.4 % (ref 11.5–15.5)
WBC: 10.9 10*3/uL — ABNORMAL HIGH (ref 4.0–10.5)
nRBC: 0 % (ref 0.0–0.2)

## 2021-09-11 NOTE — Progress Notes (Signed)
Summer King presents today for phlebotomy per MD orders. Phlebotomy procedure started at 1527 and ended at 1539. A 16G Phlebotomy kit to the RAC was used. 518 grams removed. Patient declined to be observed for 30 minutes after procedure. Patient tolerated procedure well without incident. RN provided Patient sprite prior to procedure.  IV needle removed intact. VSS at discharge.  Patient ambulatory to lobby in stable condition.

## 2021-09-18 ENCOUNTER — Inpatient Hospital Stay: Payer: 59

## 2021-09-22 ENCOUNTER — Emergency Department (HOSPITAL_BASED_OUTPATIENT_CLINIC_OR_DEPARTMENT_OTHER): Payer: 59

## 2021-09-22 ENCOUNTER — Encounter (HOSPITAL_BASED_OUTPATIENT_CLINIC_OR_DEPARTMENT_OTHER): Payer: Self-pay | Admitting: Pharmacy Technician

## 2021-09-22 ENCOUNTER — Other Ambulatory Visit: Payer: Self-pay

## 2021-09-22 ENCOUNTER — Emergency Department (HOSPITAL_BASED_OUTPATIENT_CLINIC_OR_DEPARTMENT_OTHER)
Admission: EM | Admit: 2021-09-22 | Discharge: 2021-09-22 | Disposition: A | Discharge status: Home / Self Care | Payer: 59 | Attending: Emergency Medicine | Admitting: Emergency Medicine

## 2021-09-22 ENCOUNTER — Encounter: Payer: Self-pay | Admitting: Hematology and Oncology

## 2021-09-22 DIAGNOSIS — R200 Anesthesia of skin: Secondary | ICD-10-CM | POA: Insufficient documentation

## 2021-09-22 DIAGNOSIS — Z20822 Contact with and (suspected) exposure to covid-19: Secondary | ICD-10-CM | POA: Insufficient documentation

## 2021-09-22 DIAGNOSIS — G9389 Other specified disorders of brain: Secondary | ICD-10-CM | POA: Diagnosis not present

## 2021-09-22 DIAGNOSIS — R000 Tachycardia, unspecified: Secondary | ICD-10-CM | POA: Insufficient documentation

## 2021-09-22 DIAGNOSIS — F17200 Nicotine dependence, unspecified, uncomplicated: Secondary | ICD-10-CM | POA: Insufficient documentation

## 2021-09-22 DIAGNOSIS — Y900 Blood alcohol level of less than 20 mg/100 ml: Secondary | ICD-10-CM | POA: Insufficient documentation

## 2021-09-22 DIAGNOSIS — R519 Headache, unspecified: Secondary | ICD-10-CM | POA: Diagnosis present

## 2021-09-22 LAB — PROTIME-INR
INR: 0.9 (ref 0.8–1.2)
Prothrombin Time: 12.2 seconds (ref 11.4–15.2)

## 2021-09-22 LAB — DIFFERENTIAL
Abs Immature Granulocytes: 0.03 10*3/uL (ref 0.00–0.07)
Basophils Absolute: 0.1 10*3/uL (ref 0.0–0.1)
Basophils Relative: 1 %
Eosinophils Absolute: 0.3 10*3/uL (ref 0.0–0.5)
Eosinophils Relative: 3 %
Immature Granulocytes: 0 %
Lymphocytes Relative: 13 %
Lymphs Abs: 1.4 10*3/uL (ref 0.7–4.0)
Monocytes Absolute: 0.9 10*3/uL (ref 0.1–1.0)
Monocytes Relative: 9 %
Neutro Abs: 7.9 10*3/uL — ABNORMAL HIGH (ref 1.7–7.7)
Neutrophils Relative %: 74 %

## 2021-09-22 LAB — CBC
HCT: 46.4 % — ABNORMAL HIGH (ref 36.0–46.0)
Hemoglobin: 16.5 g/dL — ABNORMAL HIGH (ref 12.0–15.0)
MCH: 36.3 pg — ABNORMAL HIGH (ref 26.0–34.0)
MCHC: 35.6 g/dL (ref 30.0–36.0)
MCV: 102 fL — ABNORMAL HIGH (ref 80.0–100.0)
Platelets: 449 10*3/uL — ABNORMAL HIGH (ref 150–400)
RBC: 4.55 MIL/uL (ref 3.87–5.11)
RDW: 13.5 % (ref 11.5–15.5)
WBC: 10.6 10*3/uL — ABNORMAL HIGH (ref 4.0–10.5)
nRBC: 0 % (ref 0.0–0.2)

## 2021-09-22 LAB — COMPREHENSIVE METABOLIC PANEL
ALT: 30 U/L (ref 0–44)
AST: 31 U/L (ref 15–41)
Albumin: 3.9 g/dL (ref 3.5–5.0)
Alkaline Phosphatase: 123 U/L (ref 38–126)
Anion gap: 12 (ref 5–15)
BUN: 5 mg/dL — ABNORMAL LOW (ref 6–20)
CO2: 21 mmol/L — ABNORMAL LOW (ref 22–32)
Calcium: 8.6 mg/dL — ABNORMAL LOW (ref 8.9–10.3)
Chloride: 104 mmol/L (ref 98–111)
Creatinine, Ser: 0.56 mg/dL (ref 0.44–1.00)
GFR, Estimated: >60 mL/min (ref >60)
Glucose, Bld: 128 mg/dL — ABNORMAL HIGH (ref 70–99)
Potassium: 3.4 mmol/L — ABNORMAL LOW (ref 3.5–5.1)
Sodium: 137 mmol/L (ref 135–145)
Total Bilirubin: 0.3 mg/dL (ref 0.3–1.2)
Total Protein: 7.8 g/dL (ref 6.5–8.1)

## 2021-09-22 LAB — RESP PANEL BY RT-PCR (FLU A&B, COVID) ARPGX2
Influenza A by PCR: NEGATIVE
Influenza B by PCR: NEGATIVE
SARS Coronavirus 2 by RT PCR: NEGATIVE

## 2021-09-22 LAB — ETHANOL: Alcohol, Ethyl (B): <10 mg/dL (ref <10)

## 2021-09-22 LAB — APTT: aPTT: 28 seconds (ref 24–36)

## 2021-09-22 MED ORDER — DEXAMETHASONE 4 MG PO TABS: 4.0000 mg | ORAL_TABLET | Freq: Two times a day (BID) | ORAL | 0 refills | Status: DC

## 2021-09-22 MED ORDER — DEXAMETHASONE 4 MG PO TABS
4.0000 mg | ORAL_TABLET | Freq: Once | ORAL | Status: AC
  Administered 2021-09-22: 4 mg via ORAL
  Filled 2021-09-22: qty 1

## 2021-09-22 NOTE — Discharge Instructions (Addendum)
The neurosurgeon should be calling you for follow-up.  Take the steroid twice a day.

## 2021-09-22 NOTE — ED Notes (Signed)
Dr. Alvino Chapel to triage to assess pt

## 2021-09-22 NOTE — ED Notes (Signed)
Discharge paperwork given and verbally understood. 

## 2021-09-22 NOTE — ED Triage Notes (Signed)
Pt here with L hand numbness onset approx 0800 today. Pt also states she has had a headache over the weekend and today her heart is "spasming". Pt tachycardic to the 140's. Tearful in triage.

## 2021-09-22 NOTE — Progress Notes (Signed)
Telestroke note:  TSRN made aware of this pt during another code stroke at this facility - cart taken to triage at 0920. Dr. Cheral Marker aware that this pt is being called for code stroke during initial page at [phone] while this RN went to gather this pts information. LNW 0820 Pt reports feeling "confused and off" and reports L arm weakness and L hand numbness. FANG D neg 0921 mRS 0  TSRN and Dr. Cheral Marker informed this code stroke was cancelled following pts imaging as the cart was being taken to pts room at 1007 at the completion of the first code stroke at this facility.   0918 was when Lonn Georgia RN was notified of this stroke being called

## 2021-09-22 NOTE — ED Provider Notes (Signed)
[facility] EMERGENCY DEPT Provider Note   CSN: [id number] Arrival date & time: 09/22/21  0901     History  Chief Complaint  Patient presents with   Numbness    Summer King is a 41 y.o. female.  HPI Patient presents with numbness to began at 8:00 this morning.  States left hand feels off.  Difficulty moving it.  States she is right-handed.  Also feels her heart pounding.  Is anxious.  Had a headache over the weekend but none now.  Today is Tuesday.  Has not had episodes like this before.  Patient does smoke.   History reviewed. No pertinent past medical history.  Home Medications Prior to Admission medications   Medication Sig Start Date End Date Taking? Authorizing Provider  dexamethasone (DECADRON) 4 MG tablet Take 1 tablet (4 mg total) by mouth 2 (two) times daily with a meal. 09/22/21  Yes Davonna Belling, MD  levonorgestrel (MIRENA) 20 MCG/24HR IUD 1 each by Intrauterine route once.    [provider]  lisinopril (ZESTRIL) 10 MG tablet Take 10 mg by mouth daily. 08/25/21   [provider]      Allergies    Codeine, Hydrocodone, and Penicillins    Review of Systems   Review of Systems  Physical Exam Updated Vital Signs BP (!) 151/113 (BP Location: Right Arm)   Pulse (!) 113   Temp 98.6 F (37 C)   Resp 16   SpO2 100%  Physical Exam Vitals and nursing note reviewed.  Eyes:     Pupils: Pupils are equal, round, and reactive to light.  Musculoskeletal:     Cervical back: Neck supple.  Neurological:     Mental Status: She is alert.     ED Results / Procedures / Treatments   Labs (all labs ordered are listed, but only abnormal results are displayed) Labs Reviewed  CBC - Abnormal; Notable for the following components:      Result Value   WBC 10.6 (*)    Hemoglobin 16.5 (*)    HCT 46.4 (*)    MCV 102.0 (*)    MCH 36.3 (*)    Platelets 449 (*)    All other components within normal limits  DIFFERENTIAL - Abnormal; Notable  for the following components:   Neutro Abs 7.9 (*)    All other components within normal limits  COMPREHENSIVE METABOLIC PANEL - Abnormal; Notable for the following components:   Potassium 3.4 (*)    CO2 21 (*)    Glucose, Bld 128 (*)    BUN 5 (*)    Calcium 8.6 (*)    All other components within normal limits  RESP PANEL BY RT-PCR (FLU A&B, COVID) ARPGX2  ETHANOL  PROTIME-INR  APTT  RAPID URINE DRUG SCREEN, HOSP PERFORMED  URINALYSIS, ROUTINE W REFLEX MICROSCOPIC  PREGNANCY, URINE    EKG EKG Interpretation  Date/Time:  Tuesday September 22 2021 09:20:17 EDT Ventricular Rate:  138 PR Interval:  134 QRS Duration: 70 QT Interval:  280 QTC Calculation: 424 R Axis:   44 Text Interpretation: Sinus tachycardia Cannot rule out Anterior infarct , age undetermined Abnormal ECG No previous ECGs available Confirmed by Davonna Belling [id number]) on 09/22/2021 12:17:43 PM  Radiology CT HEAD CODE STROKE WO CONTRAST  Result Date: 09/22/2021 CLINICAL DATA:  Code stroke.  Left arm weakness and dizziness EXAM: CT HEAD WITHOUT CONTRAST TECHNIQUE: Contiguous axial images were obtained from the base of the skull through the vertex without intravenous contrast. RADIATION DOSE  REDUCTION: This exam was performed according to the departmental dose-optimization program which includes automated exposure control, adjustment of the mA and/or kV according to patient size and/or use of iterative reconstruction technique. COMPARISON:  None Available. FINDINGS: Brain: Right parietal mass lesion measuring approximately 4.9 x 3.7 x 3.6 cm. Mass is partially calcified and abuts the calvarium and likely is extra-axial. There is mild edema in the adjacent white matter. No midline shift Ventricle size normal.  No acute infarct or hemorrhage. Vascular: Negative for hyperdense vessel Skull: Negative Sinuses/Orbits: Paranasal sinuses clear.  Negative orbit Other: None ASPECTS ([place] Stroke Program Early CT Score) Not performed  due to mass lesion. IMPRESSION: 1. Right parietal mass lesion most likely meningioma. Local mass-effect with mild vasogenic edema in the white matter. 2. Recommend MRI brain without and with contrast for further evaluation. 3. These results were called by telephone at the time of interpretation on 09/22/2021 at 9:43 am to provider Davonna Belling , who verbally acknowledged these results. Electronically Signed   By: Franchot Gallo M.D.   On: 09/22/2021 09:44    Procedures Procedures    Medications Ordered in ED Medications  dexamethasone (DECADRON) tablet 4 mg (4 mg Oral Given 09/22/21 1206)    ED Course/ Medical Decision Making/ A&P                           Medical Decision Making Amount and/or Complexity of Data Reviewed Labs: ordered. Radiology: ordered.  Risk Prescription drug management.   Patient with headache this weekend but today developed difficulty moving her left arm.  Reportedly began acutely.  Woke up normal.  Does have neurodeficits on left side.  Code stroke called.  Differential diagnosis includes complicated migraine, intracranial hemorrhage, stroke, tumor.  Initially somewhat anxious.  Feeling heart race.  Sinus tachycardia however.  Head CT done in shows likely large meningioma.  Code stroke canceled since not a TNK candidate.  Does have some edema.  Discussed with Dr. Kathyrn Sheriff from neurosurgery.  He reviewed the images.  Will start on Decadron 4 mg twice a day.  Discussed with patient and doctor about follow-up and he offered either admission to the hospital or expedited outpatient work-up.  Discussed with patient and she prefers to go home.  We will continue the steroids.  His office should be contacting her.  Doubt this is a seizure.  Likely meningioma has been there for a fair amount of time and just symptomatic now although I think likely there is symptoms she had not noticed prior.  Will discharge home.  CRITICAL CARE Performed by: Davonna Belling Total critical  care time: 30 minutes Critical care time was exclusive of separately billable procedures and treating other patients. Critical care was necessary to treat or prevent imminent or life-threatening deterioration. Critical care was time spent personally by me on the following activities: development of treatment plan with patient and/or surrogate as well as nursing, discussions with consultants, evaluation of patient's response to treatment, examination of patient, obtaining history from patient or surrogate, ordering and performing treatments and interventions, ordering and review of laboratory studies, ordering and review of radiographic studies, pulse oximetry and re-evaluation of patient's condition. I called to a code stroke here with a 5        Final Clinical Impression(s) / ED Diagnoses Final diagnoses:  Brain mass    Rx / DC Orders ED Discharge Orders          Ordered  dexamethasone (DECADRON) 4 MG tablet  2 times daily with meals        09/22/21 1209              Davonna Belling, MD 09/22/21 1219

## 2021-09-23 ENCOUNTER — Encounter: Payer: Self-pay | Admitting: Hematology and Oncology

## 2021-09-25 ENCOUNTER — Other Ambulatory Visit: Payer: Self-pay

## 2021-09-25 ENCOUNTER — Inpatient Hospital Stay: Payer: 59

## 2021-09-25 DIAGNOSIS — D751 Secondary polycythemia: Secondary | ICD-10-CM

## 2021-09-25 LAB — CBC WITH DIFFERENTIAL/PLATELET
Abs Immature Granulocytes: 0.11 10*3/uL — ABNORMAL HIGH (ref 0.00–0.07)
Basophils Absolute: 0 10*3/uL (ref 0.0–0.1)
Basophils Relative: 0 %
Eosinophils Absolute: 0 10*3/uL (ref 0.0–0.5)
Eosinophils Relative: 0 %
HCT: 45.2 % (ref 36.0–46.0)
Hemoglobin: 16 g/dL — ABNORMAL HIGH (ref 12.0–15.0)
Immature Granulocytes: 1 %
Lymphocytes Relative: 5 %
Lymphs Abs: 0.7 10*3/uL (ref 0.7–4.0)
MCH: 35.6 pg — ABNORMAL HIGH (ref 26.0–34.0)
MCHC: 35.4 g/dL (ref 30.0–36.0)
MCV: 100.4 fL — ABNORMAL HIGH (ref 80.0–100.0)
Monocytes Absolute: 0.4 10*3/uL (ref 0.1–1.0)
Monocytes Relative: 3 %
Neutro Abs: 15.1 10*3/uL — ABNORMAL HIGH (ref 1.7–7.7)
Neutrophils Relative %: 91 %
Platelets: 506 10*3/uL — ABNORMAL HIGH (ref 150–400)
RBC: 4.5 MIL/uL (ref 3.87–5.11)
RDW: 13.2 % (ref 11.5–15.5)
WBC: 16.3 10*3/uL — ABNORMAL HIGH (ref 4.0–10.5)
nRBC: 0 % (ref 0.0–0.2)

## 2021-09-25 NOTE — Progress Notes (Signed)
Patient declines phlebotomy today- Hgb is 16, offers no complaints. She states she would like an appointment with Dr. Alvy Bimler to discuss some new "developments" and health concerns that were just discovered. She said her other dr was supposed to call this office. She would like Dr. Alvy Bimler to call her or get an appointment as soon as possible to discuss this. Discussed reaching out via [employer] also. Patient states she will also call the office to leave a message. Patient left from lobby ambulatory.

## 2021-09-28 ENCOUNTER — Telehealth: Payer: Self-pay | Admitting: Internal Medicine

## 2021-09-28 ENCOUNTER — Inpatient Hospital Stay (HOSPITAL_BASED_OUTPATIENT_CLINIC_OR_DEPARTMENT_OTHER): Payer: 59 | Admitting: Hematology and Oncology

## 2021-09-28 ENCOUNTER — Other Ambulatory Visit: Payer: Self-pay

## 2021-09-28 VITALS — BP 153/97 | HR 109 | Temp 99.2°F | Resp 18 | Ht 63.0 in | Wt 165.0 lb

## 2021-09-28 DIAGNOSIS — Z720 Tobacco use: Secondary | ICD-10-CM | POA: Diagnosis not present

## 2021-09-28 DIAGNOSIS — D751 Secondary polycythemia: Secondary | ICD-10-CM

## 2021-09-28 DIAGNOSIS — D496 Neoplasm of unspecified behavior of brain: Secondary | ICD-10-CM

## 2021-09-28 DIAGNOSIS — R030 Elevated blood-pressure reading, without diagnosis of hypertension: Secondary | ICD-10-CM

## 2021-09-28 DIAGNOSIS — G9389 Other specified disorders of brain: Secondary | ICD-10-CM

## 2021-09-28 MED ORDER — DEXAMETHASONE 4 MG PO TABS: 4.0000 mg | ORAL_TABLET | Freq: Two times a day (BID) | ORAL | 0 refills | Status: DC

## 2021-09-28 NOTE — Telephone Encounter (Signed)
Scheduled appt per 8/28 referral. Pt is aware of appt date and time. Pt is aware to arrive 15 mins prior to appt time and to bring and updated insurance card. Pt is aware of appt location.   

## 2021-09-29 ENCOUNTER — Other Ambulatory Visit (HOSPITAL_COMMUNITY): Payer: Self-pay | Admitting: Neurosurgery

## 2021-09-29 ENCOUNTER — Encounter: Payer: Self-pay | Admitting: Hematology and Oncology

## 2021-09-29 DIAGNOSIS — D496 Neoplasm of unspecified behavior of brain: Secondary | ICD-10-CM | POA: Insufficient documentation

## 2021-09-29 DIAGNOSIS — D329 Benign neoplasm of meninges, unspecified: Secondary | ICD-10-CM

## 2021-09-29 NOTE — Assessment & Plan Note (Signed)
Her elevated blood pressure is likely due to anxiety I recommend she continue on current dose lisinopril

## 2021-09-29 NOTE — Assessment & Plan Note (Signed)
This is improved with aggressive phlebotomy We will hold off further phlebotomy until after her brain surgery

## 2021-09-29 NOTE — Progress Notes (Signed)
[place] OFFICE PROGRESS NOTE  Patient Care Team: Collene Leyden, MD as PCP - General (Family Medicine)  ASSESSMENT & PLAN:  Neoplasm of brain causing mass effect on adjacent structures [facility]) I have reviewed CT imaging of the head with the patient and her husband We discussed some vasogenic edema that would explain her symptoms that has improved on dexamethasone twice a day We discussed the role of neuro-oncology referral and plan of care Her neurosurgeon has ordered MRI of the head Her case will likely be discussed at the neuro-oncology tumor board in the near future I recommend she continues on current dose of dexamethasone and I renew her prescription I will follow-up on her case with the neuro oncologist in the near future  Polycythemia, secondary This is improved with aggressive phlebotomy We will hold off further phlebotomy until after her brain surgery  Tobacco abuse We discussed importance of nicotine cessation  Elevated BP without diagnosis of hypertension Her elevated blood pressure is likely due to anxiety I recommend she continue on current dose lisinopril  Orders Placed This Encounter  Procedures   Amb Referral to Neuro Oncology    Referral Priority:   Routine    Referral Type:   Consultation    Referral Reason:   Specialty Services Required    Requested Specialty:   Oncology    Number of Visits Requested:   1    All questions were answered. The patient knows to call the clinic with any problems, questions or concerns. The total time spent in the appointment was 40 minutes encounter with patients including review of chart and various tests results, discussions about plan of care and coordination of care plan   Heath Lark, MD 09/29/2021 7:30 AM  INTERVAL HISTORY: Please see below for problem oriented charting. she returns for urgent evaluation due to recent abnormal imaging studies She is here accompanied by her husband She tolerated phlebotomy  well Last week, she had evaluation in the emergency room with CT imaging of the head due to headaches and mild tingling and numbness on the left arm CT revealed abnormal brain mass The patient was recommended to be admitted but she elected to be discharged with close follow-up with neurosurgeon She was started on dexamethasone twice daily with complete resolution of her symptoms She denies recent headaches or changes in sensation on neurological function throughout She saw neurosurgeon last week who recommended MRI, that has not been scheduled yet She has numerous questions related to future plan of care and follow-up Both the patient and her husband is attempting to quit smoking  REVIEW OF SYSTEMS:   Constitutional: Denies fevers, chills or abnormal weight loss Eyes: Denies blurriness of vision Ears, nose, mouth, throat, and face: Denies mucositis or sore throat Respiratory: Denies cough, dyspnea or wheezes Cardiovascular: Denies palpitation, chest discomfort or lower extremity swelling Gastrointestinal:  Denies nausea, heartburn or change in bowel habits Skin: Denies abnormal skin rashes Lymphatics: Denies new lymphadenopathy or easy bruising Behavioral/Psych: Mood is stable, no new changes  All other systems were reviewed with the patient and are negative.  I have reviewed the past medical history, past surgical history, social history and family history with the patient and they are unchanged from previous note.  ALLERGIES:  is allergic to codeine, hydrocodone, and penicillins.  MEDICATIONS:  Current Outpatient Medications  Medication Sig Dispense Refill   dexamethasone (DECADRON) 4 MG tablet Take 1 tablet (4 mg total) by mouth 2 (two) times daily with a meal. 28  tablet 0   levonorgestrel (MIRENA) 20 MCG/24HR IUD 1 each by Intrauterine route once.     lisinopril (ZESTRIL) 10 MG tablet Take 10 mg by mouth daily.     No current facility-administered medications for this visit.     SUMMARY OF ONCOLOGIC HISTORY: Lorisa Scheid 41 y.o. female is here because of elevated hemoglobin.  She was found to have abnormal CBC from recent blood draw. Her baseline hemoglobin/CBC on November 25, 2008 was normal at 13.5 Most recently, she went to her gynecologist for evaluation and routine Pap smear Most recently, on Jun 30, 2021, repeat hemoglobin came back high at 20.1 with hematocrit of 60.1.  This was repeated and confirmed She denies intermittent headaches, shortness of breath on exertion, frequent leg cramps and occasional chest pain.  She never suffer from diagnosis of blood clot.  There is no prior diagnosis of obstructive sleep apnea. The patient denies weight loss or skin itching. The patient is a smoker and currently smokes 1 pack of cigarettes per day for the last 20 years. She also admits that she is not drinking enough liquid She drinks somewhere around 2 to 3 cups of water per day In June and August, she had several phlebotomy sessions On September 22, 2021, she had imaging study of the brain due to headaches and was found to have a mass  PHYSICAL EXAMINATION: ECOG PERFORMANCE STATUS: 0 - Asymptomatic  Vitals:   09/28/21 1112  BP: (!) 153/97  Pulse: (!) 109  Resp: 18  Temp: 99.2 F (37.3 C)  SpO2: 100%   Filed Weights   09/28/21 1112  Weight: 165 lb (74.8 kg)    GENERAL:alert, no distress and comfortable. She looks plethoric NEURO: alert & oriented x 3 with fluent speech, no focal motor/sensory deficits  LABORATORY DATA:  I have reviewed the data as listed    Component Value Date/Time   NA 137 09/22/2021 0919   K 3.4 (L) 09/22/2021 0919   CL 104 09/22/2021 0919   CO2 21 (L) 09/22/2021 0919   GLUCOSE 128 (H) 09/22/2021 0919   BUN 5 (L) 09/22/2021 0919   CREATININE 0.56 09/22/2021 0919   CALCIUM 8.6 (L) 09/22/2021 0919   PROT 7.8 09/22/2021 0919   ALBUMIN 3.9 09/22/2021 0919   AST 31 09/22/2021 0919   ALT 30 09/22/2021 0919   ALKPHOS 123  09/22/2021 0919   BILITOT 0.3 09/22/2021 0919   GFRNONAA >60 09/22/2021 0919   GFRAA  11/25/2008 0753    >60        The eGFR has been calculated using the MDRD equation. This calculation has not been validated in all clinical situations. eGFR's persistently <60 mL/min signify possible Chronic Kidney Disease.    No results found for: "SPEP", "UPEP"  Lab Results  Component Value Date   WBC 16.3 (H) 09/25/2021   NEUTROABS 15.1 (H) 09/25/2021   HGB 16.0 (H) 09/25/2021   HCT 45.2 09/25/2021   MCV 100.4 (H) 09/25/2021   PLT 506 (H) 09/25/2021      Chemistry      Component Value Date/Time   NA 137 09/22/2021 0919   K 3.4 (L) 09/22/2021 0919   CL 104 09/22/2021 0919   CO2 21 (L) 09/22/2021 0919   BUN 5 (L) 09/22/2021 0919   CREATININE 0.56 09/22/2021 0919      Component Value Date/Time   CALCIUM 8.6 (L) 09/22/2021 0919   ALKPHOS 123 09/22/2021 0919   AST 31 09/22/2021 0919   ALT 30 09/22/2021  0919   BILITOT 0.3 09/22/2021 0919       RADIOGRAPHIC STUDIES: I have personally reviewed the radiological images as listed and agreed with the findings in the report. CT HEAD CODE STROKE WO CONTRAST  Result Date: 09/22/2021 CLINICAL DATA:  Code stroke.  Left arm weakness and dizziness EXAM: CT HEAD WITHOUT CONTRAST TECHNIQUE: Contiguous axial images were obtained from the base of the skull through the vertex without intravenous contrast. RADIATION DOSE REDUCTION: This exam was performed according to the departmental dose-optimization program which includes automated exposure control, adjustment of the mA and/or kV according to patient size and/or use of iterative reconstruction technique. COMPARISON:  None Available. FINDINGS: Brain: Right parietal mass lesion measuring approximately 4.9 x 3.7 x 3.6 cm. Mass is partially calcified and abuts the calvarium and likely is extra-axial. There is mild edema in the adjacent white matter. No midline shift Ventricle size normal.  No acute  infarct or hemorrhage. Vascular: Negative for hyperdense vessel Skull: Negative Sinuses/Orbits: Paranasal sinuses clear.  Negative orbit Other: None ASPECTS ([place] Stroke Program Early CT Score) Not performed due to mass lesion. IMPRESSION: 1. Right parietal mass lesion most likely meningioma. Local mass-effect with mild vasogenic edema in the white matter. 2. Recommend MRI brain without and with contrast for further evaluation. 3. These results were called by telephone at the time of interpretation on 09/22/2021 at 9:43 am to provider Davonna Belling , who verbally acknowledged these results. Electronically Signed   By: Franchot Gallo M.D.   On: 09/22/2021 09:44

## 2021-09-29 NOTE — Assessment & Plan Note (Signed)
I have reviewed CT imaging of the head with the patient and her husband We discussed some vasogenic edema that would explain her symptoms that has improved on dexamethasone twice a day We discussed the role of neuro-oncology referral and plan of care Her neurosurgeon has ordered MRI of the head Her case will likely be discussed at the neuro-oncology tumor board in the near future I recommend she continues on current dose of dexamethasone and I renew her prescription I will follow-up on her case with the neuro oncologist in the near future

## 2021-09-29 NOTE — Assessment & Plan Note (Signed)
We discussed importance of nicotine cessation 

## 2021-09-30 ENCOUNTER — Telehealth: Payer: Self-pay | Admitting: Internal Medicine

## 2021-09-30 ENCOUNTER — Other Ambulatory Visit: Payer: Self-pay | Admitting: Radiation Therapy

## 2021-09-30 ENCOUNTER — Telehealth: Payer: Self-pay | Admitting: Radiation Therapy

## 2021-09-30 NOTE — Telephone Encounter (Signed)
  I explained the reasoning behind her visit being pushed out after the conference review and the MRI scan. She is comfortable with this. Her only concern about the delay is she is taking steroids. Dr. Alvy Bimler sent in a refill for her, but she wants to make sure she is supposed to continue with the same daily dose until then.    Per Dr. Mickeal Skinner she can drop dex to '4mg'$  daily starting Thursday 8/31 and he will manage the taper after her office visit with him. I shared this with the patient and encouraged her to call if she notices any white patches develop on her tongue, gums or roof of mouth concerning for thrush. She was thankful for the return call and instruction.    Mont Dutton R.T.(R)(T) Radiation Special Procedures Navigator

## 2021-09-30 NOTE — Telephone Encounter (Signed)
R/s pt's new pt appt per 8/30 staff msg. Pt is aware of new appt date/time.

## 2021-10-01 ENCOUNTER — Inpatient Hospital Stay: Payer: 59 | Admitting: Internal Medicine

## 2021-10-02 ENCOUNTER — Inpatient Hospital Stay: Payer: 59

## 2021-10-04 ENCOUNTER — Ambulatory Visit (HOSPITAL_COMMUNITY)
Admission: RE | Admit: 2021-10-04 | Discharge: 2021-10-04 | Disposition: A | Discharge status: Home / Self Care | Payer: 59 | Source: Ambulatory Visit | Attending: Neurosurgery | Admitting: Neurosurgery

## 2021-10-04 DIAGNOSIS — D329 Benign neoplasm of meninges, unspecified: Secondary | ICD-10-CM

## 2021-10-04 MED ORDER — GADOBUTROL 1 MMOL/ML IV SOLN
7.5000 mL | Freq: Once | INTRAVENOUS | Status: AC | PRN
  Administered 2021-10-04: 7.5 mL via INTRAVENOUS

## 2021-10-08 ENCOUNTER — Other Ambulatory Visit: Payer: Self-pay | Admitting: Neurosurgery

## 2021-10-12 ENCOUNTER — Other Ambulatory Visit: Payer: Self-pay | Admitting: Radiation Therapy

## 2021-10-12 ENCOUNTER — Inpatient Hospital Stay: Payer: 59 | Attending: Hematology and Oncology

## 2021-10-12 ENCOUNTER — Encounter: Payer: Self-pay | Admitting: Hematology and Oncology

## 2021-10-12 DIAGNOSIS — D751 Secondary polycythemia: Secondary | ICD-10-CM | POA: Insufficient documentation

## 2021-10-15 ENCOUNTER — Encounter: Payer: Self-pay | Admitting: Internal Medicine

## 2021-10-15 ENCOUNTER — Inpatient Hospital Stay (HOSPITAL_BASED_OUTPATIENT_CLINIC_OR_DEPARTMENT_OTHER): Payer: 59 | Admitting: Internal Medicine

## 2021-10-15 VITALS — BP 167/104 | HR 130 | Temp 98.1°F | Resp 16 | Ht 63.0 in | Wt 163.8 lb

## 2021-10-15 DIAGNOSIS — R569 Unspecified convulsions: Secondary | ICD-10-CM | POA: Diagnosis not present

## 2021-10-15 DIAGNOSIS — D496 Neoplasm of unspecified behavior of brain: Secondary | ICD-10-CM

## 2021-10-15 DIAGNOSIS — D751 Secondary polycythemia: Secondary | ICD-10-CM | POA: Insufficient documentation

## 2021-10-15 MED ORDER — LEVETIRACETAM 500 MG PO TABS: 500.0000 mg | ORAL_TABLET | Freq: Two times a day (BID) | ORAL | 3 refills | Status: DC

## 2021-10-15 NOTE — Progress Notes (Signed)
[place] at [address] [place], [place] [address] [phone]   New Patient Evaluation  Date of Service: 10/15/21 Patient Name: Summer King Patient MRN: [id number] Patient DOB: [birth year] Provider: Ventura Sellers, MD  Identifying Statement:  [name] is a 41 y.o. female with right frontal meningioma who presents for initial consultation and evaluation.    Referring Provider: Heath Lark, MD [place],  [place] [phone]  Oncologic History: Oncology History   No history exists.    History of Present Illness: The patient's records from the referring physician were obtained and reviewed and the patient interviewed to confirm this HPI.  [name] presented to medical attention this past month, with complaint of "woke up with left arm weakness and confusion".  She had gone to bed the night before in normal state of health.  Upon awakening, she could not use her left hand and was "staring and foggy" for several hours.  Returned to baseline by the afternoon.  CNS imaging demonstrated enhancing dural based right parietal mass.  She was discharged from ED with decadron which has now been stopped.  Today complains of anxiety, no further seizures.  Medications: Current Outpatient Medications on File Prior to Visit  Medication Sig Dispense Refill   levonorgestrel (MIRENA) 20 MCG/24HR IUD 1 each by Intrauterine route once.     lisinopril (ZESTRIL) 10 MG tablet Take 10 mg by mouth daily.     No current facility-administered medications on file prior to visit.    Allergies:  Allergies  Allergen Reactions   Codeine Hives   Erythromycin Hives   Griseofulvin Ultramicrosize [Griseofulvin] Hives    tramadol   Hydrocodone Hives   Penicillins    Scopolamine Hives   Past Medical History: History reviewed. No pertinent past medical history. Past Surgical History:  Past Surgical History:  Procedure Laterality Date    MANDIBLE FRACTURE SURGERY  4th grade   Social History:  Social History   Socioeconomic History   Marital status: Married    Spouse name: Not on file   Number of children: 1   Years of education: 12+   Highest education level: Not on file  Occupational History   Occupation: computer/office work  Tobacco Use   Smoking status: Every Day    Types: Cigarettes   Smokeless tobacco: Current   Tobacco comments:    contemplating, interested in Chantix (04/2015)  Vaping Use   Vaping Use: Some days  Substance and Sexual Activity   Alcohol use: Yes    Alcohol/week: 2.0 - 3.0 standard drinks of alcohol    Types: 2 - 3 Standard drinks or equivalent per week   Drug use: No   Sexual activity: Yes  Other Topics Concern   Not on file  Social History Narrative   Lives with her husband and son.   Social Determinants of Health   Financial Resource Strain: Not on file  Food Insecurity: Not on file  Transportation Needs: Not on file  Physical Activity: Not on file  Stress: Not on file  Social Connections: Not on file  Intimate Partner Violence: Not on file   Family History:  Family History  Problem Relation Age of Onset   Heart disease Father    Arthritis Mother        rheumatoid    Review of Systems: Constitutional: Doesn't report fevers, chills or abnormal weight loss Eyes: Doesn't report blurriness of vision Ears, nose, mouth, throat, and face:  Doesn't report sore throat Respiratory: Doesn't report cough, dyspnea or wheezes Cardiovascular: Doesn't report palpitation, chest discomfort  Gastrointestinal:  Doesn't report nausea, constipation, diarrhea GU: Doesn't report incontinence Skin: Doesn't report skin rashes Neurological: Per HPI Musculoskeletal: Doesn't report joint pain Behavioral/Psych: Doesn't report anxiety  Physical Exam: Vitals:   10/15/21 1051  BP: (!) 167/104  Pulse: (!) 130  Resp: 16  Temp: 98.1 F (36.7 C)  SpO2: 95%   KPS: 90. General: Alert,  cooperative, pleasant, in no acute distress Head: Normal EENT: No conjunctival injection or scleral icterus.  Lungs: Resp effort normal Cardiac: Regular rate Abdomen: Non-distended abdomen Skin: No rashes cyanosis or petechiae. Extremities: No clubbing or edema  Neurologic Exam: Mental Status: Awake, alert, attentive to examiner. Oriented to self and environment. Language is fluent with intact comprehension.  Cranial Nerves: Visual acuity is grossly normal. Visual fields are full. Extra-ocular movements intact. No ptosis. Face is symmetric Motor: Tone and bulk are normal. Power is full in both arms and legs. Reflexes are symmetric, no pathologic reflexes present.  Sensory: Intact to light touch Gait: Normal.   Labs: I have reviewed the data as listed    Component Value Date/Time   NA 137 09/22/2021 0919   K 3.4 (L) 09/22/2021 0919   CL 104 09/22/2021 0919   CO2 21 (L) 09/22/2021 0919   GLUCOSE 128 (H) 09/22/2021 0919   BUN 5 (L) 09/22/2021 0919   CREATININE 0.56 09/22/2021 0919   CALCIUM 8.6 (L) 09/22/2021 0919   PROT 7.8 09/22/2021 0919   ALBUMIN 3.9 09/22/2021 0919   AST 31 09/22/2021 0919   ALT 30 09/22/2021 0919   ALKPHOS 123 09/22/2021 0919   BILITOT 0.3 09/22/2021 0919   GFRNONAA >60 09/22/2021 0919   GFRAA  11/25/2008 0753    >60        The eGFR has been calculated using the MDRD equation. This calculation has not been validated in all clinical situations. eGFR's persistently <60 mL/min signify possible Chronic Kidney Disease.   Lab Results  Component Value Date   WBC 16.3 (H) 09/25/2021   NEUTROABS 15.1 (H) 09/25/2021   HGB 16.0 (H) 09/25/2021   HCT 45.2 09/25/2021   MCV 100.4 (H) 09/25/2021   PLT 506 (H) 09/25/2021    Imaging: MR BRAIN W WO CONTRAST  Result Date: 10/06/2021 CLINICAL DATA:  Meningioma EXAM: MRI HEAD WITHOUT AND WITH CONTRAST TECHNIQUE: Multiplanar, multiecho pulse sequences of the brain and surrounding structures were obtained  without and with intravenous contrast. CONTRAST:  7.23m GADAVIST GADOBUTROL 1 MMOL/ML IV SOLN COMPARISON:  None Available. FINDINGS: Brain: Right parietal convexity extra-axial dural-based lesion with heterogeneous enhancement. Measures 4.1 x 4.4 x 4.5 cm. Associated susceptibility likely represents calcification seen on CT. There is mass effect on the adjacent parenchyma with parenchymal edema. No hydrocephalus or extra-axial collection. No acute infarction or hemorrhage. Additional patchy foci of T2 hyperintensity in the supratentorial white matter are nonspecific but may reflect mild chronic microvascular ischemic changes. Vascular: Major vessel flow voids at the skull base are preserved. Skull and upper cervical spine: Normal marrow signal is preserved. Sinuses/Orbits: Paranasal sinuses are aerated. Orbits are unremarkable. Other: Sella is unremarkable.  Mastoid air cells are clear. IMPRESSION: Right parietal convexity extra-axial mass most consistent with a meningioma. There is regional mass effect with mild parenchymal edema. Electronically Signed   By: PMacy MisM.D.   On: 10/06/2021 13:48   CT HEAD CODE STROKE WO CONTRAST  Result Date: 09/22/2021 CLINICAL DATA:  Code  stroke.  Left arm weakness and dizziness EXAM: CT HEAD WITHOUT CONTRAST TECHNIQUE: Contiguous axial images were obtained from the base of the skull through the vertex without intravenous contrast. RADIATION DOSE REDUCTION: This exam was performed according to the departmental dose-optimization program which includes automated exposure control, adjustment of the mA and/or kV according to patient size and/or use of iterative reconstruction technique. COMPARISON:  None Available. FINDINGS: Brain: Right parietal mass lesion measuring approximately 4.9 x 3.7 x 3.6 cm. Mass is partially calcified and abuts the calvarium and likely is extra-axial. There is mild edema in the adjacent white matter. No midline shift Ventricle size normal.  No  acute infarct or hemorrhage. Vascular: Negative for hyperdense vessel Skull: Negative Sinuses/Orbits: Paranasal sinuses clear.  Negative orbit Other: None ASPECTS ([place] Stroke Program Early CT Score) Not performed due to mass lesion. IMPRESSION: 1. Right parietal mass lesion most likely meningioma. Local mass-effect with mild vasogenic edema in the white matter. 2. Recommend MRI brain without and with contrast for further evaluation. 3. These results were called by telephone at the time of interpretation on 09/22/2021 at 9:43 am to provider Davonna Belling , who verbally acknowledged these results. Electronically Signed   By: Franchot Gallo M.D.   On: 09/22/2021 09:44     Assessment/Plan Neoplasm of brain causing mass effect on adjacent structures [facility])  Focal seizure ([place])  We appreciate the opportunity to participate in the care of Macel Yearsley.  She presents with clinical and radiographic syndrome consistent with focal seizure; etiology is right frontoparietal dural based neoplasm, c/w likely meningioma.    She is scheduled for surgery/resection on 10/30/21 with Dr. Kathyrn Sheriff.    For focal seizure, we provided epilepsy safety counseling and reviewed driving restrictions.    Will initiate therapy with Keppra 54m BID, she is agreeable with this.  Screening for potential clinical trials was performed and discussed using eligibility criteria for active protocols [facility], as well as national database available on [email or web address]    The patient is not a candidate for a research protocol at this time due to no suitable study identified.   We spent twenty additional minutes teaching regarding the natural history, biology, and historical experience in the treatment of brain tumors. We then discussed in detail the current recommendations for therapy focusing on the mode of administration, mechanism of action, anticipated toxicities, and quality of life  issues associated with this plan. We also provided teaching sheets for the patient to take home as an additional resource.  We ask that NDestina Manteireturn to clinic in 2 months following surgery, path eval, or sooner as needed.  All questions were answered. The patient knows to call the clinic with any problems, questions or concerns. No barriers to learning were detected.  The total time spent in the encounter was 40 minutes and more than 50% was on counseling and review of test results   ZVentura Sellers MD Medical Director of Neuro-Oncology [facility] WEctor09/14/23 2:03 PM

## 2021-10-22 NOTE — Pre-Procedure Instructions (Signed)
Surgical Instructions    Your procedure is scheduled on Friday, September 29.  Report to [facility] Main Entrance "A" at 11:30 A.M., then check in with the Admitting office.  Call this number if you have problems the morning of surgery:  [phone]   If you have any questions prior to your surgery date call [phone]: Open Monday-Friday 8am-4pm    Remember:  Do not eat or drink after midnight the night before your surgery     Take these medicines the morning of surgery with A SIP OF WATER: levETIRAcetam (KEPPRA)    As of today, STOP taking any Aspirin (unless otherwise instructed by your surgeon) Aleve, Naproxen, Ibuprofen, Motrin, Advil, Goody's, BC's, all herbal medications, fish oil, and all vitamins.           [facility] is not responsible for any belongings or valuables.    Do NOT Smoke (Tobacco/Vaping)  24 hours prior to your procedure  If you use a CPAP at night, you may bring your mask for your overnight stay.   Contacts, glasses, hearing aids, dentures or partials may not be worn into surgery, please bring cases for these belongings   For patients admitted to the hospital, discharge time will be determined by your treatment team.   Patients discharged the day of surgery will not be allowed to drive home, and someone needs to stay with them for 24 hours.   SURGICAL WAITING ROOM VISITATION Patients having surgery or a procedure may have no more than 2 support people in the waiting area - these visitors may rotate.   Children under the age of 4 must have an adult with them who is not the patient. If the patient needs to stay at the hospital during part of their recovery, the visitor guidelines for inpatient rooms apply. Pre-op nurse will coordinate an appropriate time for 1 support person to accompany patient in pre-op.  This support person may not rotate.   Please refer to the [facility] website for the visitor guidelines for Inpatients (after your surgery is  over and you are in a regular room).    Special instructions:    Oral Hygiene is also important to reduce your risk of infection.  Remember - BRUSH YOUR TEETH THE MORNING OF SURGERY WITH YOUR REGULAR TOOTHPASTE   [facility]- Preparing For Surgery  Before surgery, you can play an important role. Because skin is not sterile, your skin needs to be as free of germs as possible. You can reduce the number of germs on your skin by washing with CHG (chlorahexidine gluconate) Soap before surgery.  CHG is an antiseptic cleaner which kills germs and bonds with the skin to continue killing germs even after washing.     Please do not use if you have an allergy to CHG or antibacterial soaps. If your skin becomes reddened/irritated stop using the CHG.  Do not shave (including legs and underarms) for at least 48 hours prior to first CHG shower. It is OK to shave your face.  Please follow these instructions carefully.     Shower the NIGHT BEFORE SURGERY and the MORNING OF SURGERY with CHG Soap.   If you chose to wash your hair, wash your hair first as usual with your normal shampoo. After you shampoo, rinse your hair and body thoroughly to remove the shampoo.  Then [employer] and genitals (private parts) with your normal soap and rinse thoroughly to remove soap.  After that Use CHG Soap as you would  any other liquid soap. You can apply CHG directly to the skin and wash gently with a scrungie or a clean washcloth.   Apply the CHG Soap to your body ONLY FROM THE NECK DOWN.  Do not use on open wounds or open sores. Avoid contact with your eyes, ears, mouth and genitals (private parts). Wash Face and genitals (private parts)  with your normal soap.   Wash thoroughly, paying special attention to the area where your surgery will be performed.  Thoroughly rinse your body with warm water from the neck down.  DO NOT shower/wash with your normal soap after using and rinsing off the CHG Soap.  Pat yourself dry  with a CLEAN TOWEL.  Wear CLEAN PAJAMAS to bed the night before surgery  Place CLEAN SHEETS on your bed the night before your surgery  DO NOT SLEEP WITH PETS.   Day of Surgery:  Take a shower with CHG soap. Wear Clean/Comfortable clothing the morning of surgery Do not wear jewelry or makeup. Do not wear lotions, powders, perfumes/cologne or deodorant. Do not shave 48 hours prior to surgery.  Men may shave face and neck. Do not bring valuables to the hospital. Do not wear nail polish, gel polish, artificial nails, or any other type of covering on natural nails (fingers and toes) If you have artificial nails or gel coating that need to be removed by a nail salon, please have this removed prior to surgery. Artificial nails or gel coating may interfere with anesthesia's ability to adequately monitor your vital signs.   Remember to brush your teeth WITH YOUR REGULAR TOOTHPASTE.    If you received a COVID test during your pre-op visit, it is requested that you wear a mask when out in public, stay away from anyone that may not be feeling well, and notify your surgeon if you develop symptoms. If you have been in contact with anyone that has tested positive in the last 10 days, please notify your surgeon.    Please read over the following fact sheets that you were given.

## 2021-10-23 ENCOUNTER — Encounter (HOSPITAL_COMMUNITY): Payer: Self-pay

## 2021-10-23 ENCOUNTER — Encounter (HOSPITAL_COMMUNITY)
Admission: RE | Admit: 2021-10-23 | Discharge: 2021-10-23 | Disposition: A | Discharge status: Home / Self Care | Payer: 59 | Source: Ambulatory Visit | Attending: Neurosurgery | Admitting: Neurosurgery

## 2021-10-23 ENCOUNTER — Other Ambulatory Visit: Payer: Self-pay

## 2021-10-23 VITALS — Temp 98.4°F | Resp 17 | Ht 61.0 in | Wt 161.8 lb

## 2021-10-23 DIAGNOSIS — Z01812 Encounter for preprocedural laboratory examination: Secondary | ICD-10-CM | POA: Insufficient documentation

## 2021-10-23 DIAGNOSIS — Z01818 Encounter for other preprocedural examination: Secondary | ICD-10-CM

## 2021-10-23 HISTORY — DX: Essential (primary) hypertension: I10

## 2021-10-23 LAB — CBC
HCT: 45.5 % (ref 36.0–46.0)
Hemoglobin: 16.1 g/dL — ABNORMAL HIGH (ref 12.0–15.0)
MCH: 35.5 pg — ABNORMAL HIGH (ref 26.0–34.0)
MCHC: 35.4 g/dL (ref 30.0–36.0)
MCV: 100.2 fL — ABNORMAL HIGH (ref 80.0–100.0)
Platelets: 347 10*3/uL (ref 150–400)
RBC: 4.54 MIL/uL (ref 3.87–5.11)
RDW: 13.7 % (ref 11.5–15.5)
WBC: 9.1 10*3/uL (ref 4.0–10.5)
nRBC: 0 % (ref 0.0–0.2)

## 2021-10-23 LAB — BASIC METABOLIC PANEL
Anion gap: 8 (ref 5–15)
BUN: 9 mg/dL (ref 6–20)
CO2: 22 mmol/L (ref 22–32)
Calcium: 9.2 mg/dL (ref 8.9–10.3)
Chloride: 106 mmol/L (ref 98–111)
Creatinine, Ser: 0.71 mg/dL (ref 0.44–1.00)
GFR, Estimated: >60 mL/min (ref >60)
Glucose, Bld: 108 mg/dL — ABNORMAL HIGH (ref 70–99)
Potassium: 4.2 mmol/L (ref 3.5–5.1)
Sodium: 136 mmol/L (ref 135–145)

## 2021-10-23 LAB — TYPE AND SCREEN
ABO/RH(D): A POS
Antibody Screen: NEGATIVE

## 2021-10-23 NOTE — Progress Notes (Signed)
PCP - Collene Leyden MD Cardiologist - None  PPM/ICD - Denies Device Orders -  Rep Notified -   Chest x-ray - none EKG - 09/23/21 Stress Test - none ECHO - none Cardiac Cath - none  Sleep Study - none CPAP -   Fasting Blood Sugar - na Checks Blood Sugar _____ times a day  Blood Thinner Instructions:na Aspirin Instructions:na  ERAS Protcol -no PRE-SURGERY Ensure or G2-   COVID TEST- no   Anesthesia review: no  Patient denies shortness of breath, fever, cough and chest pain at PAT appointment   All instructions explained to the patient, with a verbal understanding of the material. Patient agrees to go over the instructions while at home for a better understanding. Patient also instructed to wear a mask when out in public prior to surgery.  The opportunity to ask questions was provided.

## 2021-10-30 ENCOUNTER — Inpatient Hospital Stay (HOSPITAL_COMMUNITY)
Admission: RE | Admit: 2021-10-30 | Discharge: 2021-10-31 | DRG: 025 | Disposition: A | Discharge status: Home / Self Care | Payer: 59 | Attending: Neurosurgery | Admitting: Neurosurgery

## 2021-10-30 ENCOUNTER — Other Ambulatory Visit: Payer: Self-pay

## 2021-10-30 ENCOUNTER — Encounter (HOSPITAL_COMMUNITY)
Admission: RE | Disposition: A | Discharge status: Home / Self Care | Payer: Self-pay | Source: Home / Self Care | Attending: Neurosurgery

## 2021-10-30 ENCOUNTER — Inpatient Hospital Stay (HOSPITAL_COMMUNITY): Payer: 59 | Admitting: Certified Registered"

## 2021-10-30 ENCOUNTER — Encounter (HOSPITAL_COMMUNITY): Payer: Self-pay | Admitting: Neurosurgery

## 2021-10-30 DIAGNOSIS — G936 Cerebral edema: Secondary | ICD-10-CM | POA: Diagnosis present

## 2021-10-30 DIAGNOSIS — Z880 Allergy status to penicillin: Secondary | ICD-10-CM

## 2021-10-30 DIAGNOSIS — Z888 Allergy status to other drugs, medicaments and biological substances status: Secondary | ICD-10-CM

## 2021-10-30 DIAGNOSIS — Z793 Long term (current) use of hormonal contraceptives: Secondary | ICD-10-CM | POA: Diagnosis not present

## 2021-10-30 DIAGNOSIS — R482 Apraxia: Secondary | ICD-10-CM | POA: Diagnosis present

## 2021-10-30 DIAGNOSIS — I10 Essential (primary) hypertension: Secondary | ICD-10-CM | POA: Diagnosis present

## 2021-10-30 DIAGNOSIS — Z79899 Other long term (current) drug therapy: Secondary | ICD-10-CM | POA: Diagnosis not present

## 2021-10-30 DIAGNOSIS — Z881 Allergy status to other antibiotic agents status: Secondary | ICD-10-CM | POA: Diagnosis not present

## 2021-10-30 DIAGNOSIS — F17210 Nicotine dependence, cigarettes, uncomplicated: Secondary | ICD-10-CM | POA: Diagnosis present

## 2021-10-30 DIAGNOSIS — D320 Benign neoplasm of cerebral meninges: Principal | ICD-10-CM | POA: Diagnosis present

## 2021-10-30 DIAGNOSIS — D329 Benign neoplasm of meninges, unspecified: Secondary | ICD-10-CM

## 2021-10-30 DIAGNOSIS — Z01818 Encounter for other preprocedural examination: Secondary | ICD-10-CM

## 2021-10-30 DIAGNOSIS — Z885 Allergy status to narcotic agent status: Secondary | ICD-10-CM | POA: Diagnosis not present

## 2021-10-30 HISTORY — PX: APPLICATION OF CRANIAL NAVIGATION: SHX6578

## 2021-10-30 HISTORY — PX: CRANIOTOMY: SHX93

## 2021-10-30 LAB — POCT PREGNANCY, URINE: Preg Test, Ur: NEGATIVE

## 2021-10-30 LAB — ABO/RH: ABO/RH(D): A POS

## 2021-10-30 SURGERY — CRANIOTOMY TUMOR EXCISION
Anesthesia: General | Site: Head | Laterality: Right

## 2021-10-30 MED ORDER — CHLORHEXIDINE GLUCONATE CLOTH 2 % EX PADS: 6.0000 | MEDICATED_PAD | Freq: Once | CUTANEOUS | Status: DC

## 2021-10-30 MED ORDER — CHLORHEXIDINE GLUCONATE CLOTH 2 % EX PADS: 6.0000 | MEDICATED_PAD | Freq: Every day | CUTANEOUS | Status: DC

## 2021-10-30 MED ORDER — BACITRACIN ZINC 500 UNIT/GM EX OINT
TOPICAL_OINTMENT | CUTANEOUS | Status: AC
  Filled 2021-10-30: qty 28.35

## 2021-10-30 MED ORDER — LIDOCAINE 2% (20 MG/ML) 5 ML SYRINGE
INTRAMUSCULAR | Status: AC
  Filled 2021-10-30: qty 5

## 2021-10-30 MED ORDER — LABETALOL HCL 5 MG/ML IV SOLN
10.0000 mg | INTRAVENOUS | Status: DC | PRN
  Administered 2021-10-30 – 2021-10-31 (×2): 20 mg via INTRAVENOUS
  Filled 2021-10-30 (×2): qty 4

## 2021-10-30 MED ORDER — ACETAMINOPHEN 325 MG PO TABS
650.0000 mg | ORAL_TABLET | ORAL | Status: DC | PRN
  Administered 2021-10-30 – 2021-10-31 (×3): 650 mg via ORAL
  Filled 2021-10-30 (×3): qty 2

## 2021-10-30 MED ORDER — THROMBIN 20000 UNITS EX SOLR
CUTANEOUS | Status: DC | PRN
  Administered 2021-10-30: 20 mL via TOPICAL

## 2021-10-30 MED ORDER — SENNOSIDES-DOCUSATE SODIUM 8.6-50 MG PO TABS: 1.0000 | ORAL_TABLET | Freq: Every evening | ORAL | Status: DC | PRN

## 2021-10-30 MED ORDER — 0.9 % SODIUM CHLORIDE (POUR BTL) OPTIME
TOPICAL | Status: DC | PRN
  Administered 2021-10-30: 3000 mL

## 2021-10-30 MED ORDER — LISINOPRIL 10 MG PO TABS
10.0000 mg | ORAL_TABLET | Freq: Every day | ORAL | Status: DC
  Administered 2021-10-30 – 2021-10-31 (×2): 10 mg via ORAL
  Filled 2021-10-30 (×2): qty 1

## 2021-10-30 MED ORDER — LACTATED RINGERS IV SOLN: INTRAVENOUS | Status: DC

## 2021-10-30 MED ORDER — THROMBIN 5000 UNITS EX SOLR
OROMUCOSAL | Status: DC | PRN
  Administered 2021-10-30: 5 mL via TOPICAL

## 2021-10-30 MED ORDER — ORAL CARE MOUTH RINSE: 15.0000 mL | Freq: Once | OROMUCOSAL | Status: AC

## 2021-10-30 MED ORDER — HYDROMORPHONE HCL 1 MG/ML IJ SOLN: 0.5000 mg | INTRAMUSCULAR | Status: DC | PRN

## 2021-10-30 MED ORDER — ACETAMINOPHEN 10 MG/ML IV SOLN
1000.0000 mg | Freq: Once | INTRAVENOUS | Status: DC | PRN
  Administered 2021-10-30: 1000 mg via INTRAVENOUS

## 2021-10-30 MED ORDER — ACETAMINOPHEN 325 MG PO TABS: 325.0000 mg | ORAL_TABLET | ORAL | Status: DC | PRN

## 2021-10-30 MED ORDER — BUPIVACAINE HCL (PF) 0.5 % IJ SOLN
INTRAMUSCULAR | Status: AC
  Filled 2021-10-30: qty 30

## 2021-10-30 MED ORDER — ONDANSETRON HCL 4 MG/2ML IJ SOLN: 4.0000 mg | INTRAMUSCULAR | Status: DC | PRN

## 2021-10-30 MED ORDER — ONDANSETRON HCL 4 MG PO TABS: 4.0000 mg | ORAL_TABLET | ORAL | Status: DC | PRN

## 2021-10-30 MED ORDER — ESMOLOL HCL 100 MG/10ML IV SOLN
INTRAVENOUS | Status: DC | PRN
  Administered 2021-10-30: 20 mg via INTRAVENOUS

## 2021-10-30 MED ORDER — BUPIVACAINE HCL (PF) 0.5 % IJ SOLN
INTRAMUSCULAR | Status: DC | PRN
  Administered 2021-10-30: 8 mL

## 2021-10-30 MED ORDER — DEXAMETHASONE SODIUM PHOSPHATE 10 MG/ML IJ SOLN
INTRAMUSCULAR | Status: AC
  Filled 2021-10-30: qty 1

## 2021-10-30 MED ORDER — BACITRACIN ZINC 500 UNIT/GM EX OINT
TOPICAL_OINTMENT | CUTANEOUS | Status: DC | PRN
  Administered 2021-10-30: 1 via TOPICAL

## 2021-10-30 MED ORDER — SODIUM CHLORIDE 0.9 % IV SOLN: INTRAVENOUS | Status: DC

## 2021-10-30 MED ORDER — PHENYLEPHRINE 80 MCG/ML (10ML) SYRINGE FOR IV PUSH (FOR BLOOD PRESSURE SUPPORT)
PREFILLED_SYRINGE | INTRAVENOUS | Status: AC
  Filled 2021-10-30: qty 10

## 2021-10-30 MED ORDER — FENTANYL CITRATE (PF) 250 MCG/5ML IJ SOLN
INTRAMUSCULAR | Status: AC
  Filled 2021-10-30: qty 5

## 2021-10-30 MED ORDER — HEMOSTATIC AGENTS (NO CHARGE) OPTIME
TOPICAL | Status: DC | PRN
  Administered 2021-10-30: 1

## 2021-10-30 MED ORDER — SODIUM CHLORIDE 0.9 % IV SOLN
0.1500 ug/kg/min | INTRAVENOUS | Status: DC | PRN
  Administered 2021-10-30: .15 ug/kg/min via INTRAVENOUS
  Filled 2021-10-30 (×2): qty 2000

## 2021-10-30 MED ORDER — LIDOCAINE-EPINEPHRINE 1 %-1:100000 IJ SOLN
INTRAMUSCULAR | Status: AC
  Filled 2021-10-30: qty 1

## 2021-10-30 MED ORDER — ACETAMINOPHEN 160 MG/5ML PO SOLN: 325.0000 mg | ORAL | Status: DC | PRN

## 2021-10-30 MED ORDER — FENTANYL CITRATE (PF) 100 MCG/2ML IJ SOLN
INTRAMUSCULAR | Status: DC | PRN
  Administered 2021-10-30: 50 ug via INTRAVENOUS
  Administered 2021-10-30: 25 ug via INTRAVENOUS
  Administered 2021-10-30: 50 ug via INTRAVENOUS
  Administered 2021-10-30: 25 ug via INTRAVENOUS

## 2021-10-30 MED ORDER — CHLORHEXIDINE GLUCONATE CLOTH 2 % EX PADS
6.0000 | MEDICATED_PAD | Freq: Once | CUTANEOUS | Status: DC
  Administered 2021-10-30: 6 via TOPICAL

## 2021-10-30 MED ORDER — LABETALOL HCL 5 MG/ML IV SOLN
INTRAVENOUS | Status: AC
  Filled 2021-10-30: qty 4

## 2021-10-30 MED ORDER — DEXAMETHASONE SODIUM PHOSPHATE 10 MG/ML IJ SOLN
INTRAMUSCULAR | Status: DC | PRN
  Administered 2021-10-30: 10 mg via INTRAVENOUS

## 2021-10-30 MED ORDER — THROMBIN 5000 UNITS EX SOLR
CUTANEOUS | Status: AC
  Filled 2021-10-30: qty 5000

## 2021-10-30 MED ORDER — THROMBIN 20000 UNITS EX SOLR
CUTANEOUS | Status: AC
  Filled 2021-10-30: qty 20000

## 2021-10-30 MED ORDER — CHLORHEXIDINE GLUCONATE 0.12 % MT SOLN
OROMUCOSAL | Status: AC
  Administered 2021-10-30: 15 mL via OROMUCOSAL
  Filled 2021-10-30: qty 15

## 2021-10-30 MED ORDER — PHENYLEPHRINE HCL-NACL 20-0.9 MG/250ML-% IV SOLN
INTRAVENOUS | Status: DC | PRN
  Administered 2021-10-30: 25 ug/min via INTRAVENOUS

## 2021-10-30 MED ORDER — PROMETHAZINE HCL 25 MG PO TABS: 12.5000 mg | ORAL_TABLET | ORAL | Status: DC | PRN

## 2021-10-30 MED ORDER — ACETAMINOPHEN 10 MG/ML IV SOLN
INTRAVENOUS | Status: AC
  Filled 2021-10-30: qty 100

## 2021-10-30 MED ORDER — LIDOCAINE-EPINEPHRINE 1 %-1:100000 IJ SOLN
INTRAMUSCULAR | Status: DC | PRN
  Administered 2021-10-30: 8 mL

## 2021-10-30 MED ORDER — ONDANSETRON HCL 4 MG/2ML IJ SOLN
INTRAMUSCULAR | Status: AC
  Filled 2021-10-30: qty 2

## 2021-10-30 MED ORDER — VANCOMYCIN HCL IN DEXTROSE 1-5 GM/200ML-% IV SOLN: 1000.0000 mg | INTRAVENOUS | Status: AC

## 2021-10-30 MED ORDER — LEVETIRACETAM 500 MG PO TABS
500.0000 mg | ORAL_TABLET | Freq: Two times a day (BID) | ORAL | Status: DC
  Administered 2021-10-30 – 2021-10-31 (×2): 500 mg via ORAL
  Filled 2021-10-30 (×3): qty 1

## 2021-10-30 MED ORDER — PROPOFOL 10 MG/ML IV BOLUS
INTRAVENOUS | Status: DC | PRN
  Administered 2021-10-30: 30 mg via INTRAVENOUS
  Administered 2021-10-30: 70 mg via INTRAVENOUS
  Administered 2021-10-30: 100 mg via INTRAVENOUS

## 2021-10-30 MED ORDER — PROMETHAZINE HCL 25 MG/ML IJ SOLN: 6.2500 mg | INTRAMUSCULAR | Status: DC | PRN

## 2021-10-30 MED ORDER — VANCOMYCIN HCL IN DEXTROSE 1-5 GM/200ML-% IV SOLN
INTRAVENOUS | Status: AC
  Administered 2021-10-30: 1000 mg via INTRAVENOUS
  Filled 2021-10-30: qty 200

## 2021-10-30 MED ORDER — SUGAMMADEX SODIUM 200 MG/2ML IV SOLN
INTRAVENOUS | Status: DC | PRN
  Administered 2021-10-30: 200 mg via INTRAVENOUS

## 2021-10-30 MED ORDER — OXYCODONE HCL 5 MG PO TABS: 5.0000 mg | ORAL_TABLET | ORAL | Status: DC | PRN

## 2021-10-30 MED ORDER — BISACODYL 10 MG RE SUPP: 10.0000 mg | Freq: Every day | RECTAL | Status: DC | PRN

## 2021-10-30 MED ORDER — ORAL CARE MOUTH RINSE: 15.0000 mL | OROMUCOSAL | Status: DC | PRN

## 2021-10-30 MED ORDER — ONDANSETRON HCL 4 MG/2ML IJ SOLN
INTRAMUSCULAR | Status: DC | PRN
  Administered 2021-10-30: 4 mg via INTRAVENOUS

## 2021-10-30 MED ORDER — ROCURONIUM BROMIDE 10 MG/ML (PF) SYRINGE
PREFILLED_SYRINGE | INTRAVENOUS | Status: AC
  Filled 2021-10-30: qty 10

## 2021-10-30 MED ORDER — PHENYLEPHRINE 80 MCG/ML (10ML) SYRINGE FOR IV PUSH (FOR BLOOD PRESSURE SUPPORT)
PREFILLED_SYRINGE | INTRAVENOUS | Status: DC | PRN
  Administered 2021-10-30: 160 ug via INTRAVENOUS
  Administered 2021-10-30 (×2): 80 ug via INTRAVENOUS

## 2021-10-30 MED ORDER — MICROFIBRILLAR COLL HEMOSTAT EX PADS
MEDICATED_PAD | CUTANEOUS | Status: DC | PRN
  Administered 2021-10-30: 1 via TOPICAL

## 2021-10-30 MED ORDER — FENTANYL CITRATE (PF) 100 MCG/2ML IJ SOLN: 25.0000 ug | INTRAMUSCULAR | Status: DC | PRN

## 2021-10-30 MED ORDER — LIDOCAINE 2% (20 MG/ML) 5 ML SYRINGE
INTRAMUSCULAR | Status: DC | PRN
  Administered 2021-10-30: 40 mg via INTRAVENOUS

## 2021-10-30 MED ORDER — PROPOFOL 10 MG/ML IV BOLUS
INTRAVENOUS | Status: AC
  Filled 2021-10-30: qty 20

## 2021-10-30 MED ORDER — AMISULPRIDE (ANTIEMETIC) 5 MG/2ML IV SOLN: 10.0000 mg | Freq: Once | INTRAVENOUS | Status: DC | PRN

## 2021-10-30 MED ORDER — ACETAMINOPHEN 650 MG RE SUPP: 650.0000 mg | RECTAL | Status: DC | PRN

## 2021-10-30 MED ORDER — ROCURONIUM BROMIDE 10 MG/ML (PF) SYRINGE
PREFILLED_SYRINGE | INTRAVENOUS | Status: DC | PRN
  Administered 2021-10-30: 60 mg via INTRAVENOUS
  Administered 2021-10-30: 30 mg via INTRAVENOUS

## 2021-10-30 MED ORDER — LEVONORGESTREL 20 MCG/24HR IU IUD: 1.0000 | INTRAUTERINE_SYSTEM | Freq: Once | INTRAUTERINE | Status: DC

## 2021-10-30 MED ORDER — VANCOMYCIN HCL IN DEXTROSE 1-5 GM/200ML-% IV SOLN
1000.0000 mg | Freq: Two times a day (BID) | INTRAVENOUS | Status: AC
  Administered 2021-10-31: 1000 mg via INTRAVENOUS
  Filled 2021-10-30: qty 200

## 2021-10-30 MED ORDER — PANTOPRAZOLE SODIUM 40 MG IV SOLR
40.0000 mg | Freq: Every day | INTRAVENOUS | Status: DC
  Administered 2021-10-30: 40 mg via INTRAVENOUS
  Filled 2021-10-30: qty 10

## 2021-10-30 MED ORDER — CHLORHEXIDINE GLUCONATE 0.12 % MT SOLN: 15.0000 mL | Freq: Once | OROMUCOSAL | Status: AC

## 2021-10-30 MED ORDER — LABETALOL HCL 5 MG/ML IV SOLN
10.0000 mg | Freq: Once | INTRAVENOUS | Status: AC
  Administered 2021-10-30: 10 mg via INTRAVENOUS

## 2021-10-30 SURGICAL SUPPLY — 98 items
BAG COUNTER SPONGE SURGICOUNT (BAG) ×2 IMPLANT
BAND RUBBER #18 3X1/16 STRL (MISCELLANEOUS) IMPLANT
BENZOIN TINCTURE PRP APPL 2/3 (GAUZE/BANDAGES/DRESSINGS) IMPLANT
BLADE CLIPPER SURG (BLADE) ×2 IMPLANT
BLADE SAW GIGLI 16 STRL (MISCELLANEOUS) IMPLANT
BLADE SURG 15 STRL LF DISP TIS (BLADE) IMPLANT
BLADE SURG 15 STRL SS (BLADE)
BLADE ULTRA TIP 2M (BLADE) ×2 IMPLANT
BNDG ELASTIC 4X5.8 VLCR NS LF (GAUZE/BANDAGES/DRESSINGS) IMPLANT
BNDG GAUZE DERMACEA FLUFF 4 (GAUZE/BANDAGES/DRESSINGS) IMPLANT
BNDG STRETCH 4X75 NS LF (GAUZE/BANDAGES/DRESSINGS) IMPLANT
BNDG STRETCH 4X75 STRL LF (GAUZE/BANDAGES/DRESSINGS) IMPLANT
BUR ACORN 6.0 PRECISION (BURR) ×2 IMPLANT
BUR ROUND FLUTED 4 SOFT TCH (BURR) IMPLANT
BUR SPIRAL ROUTER 2.3 (BUR) ×2 IMPLANT
CANISTER SUCT 3000ML PPV (MISCELLANEOUS) ×4 IMPLANT
CATH VENTRIC 35X38 W/TROCAR LG (CATHETERS) IMPLANT
CLIP VESOCCLUDE MED 6/CT (CLIP) IMPLANT
CNTNR URN SCR LID CUP LEK RST (MISCELLANEOUS) ×2 IMPLANT
CONT SPEC 4OZ STRL OR WHT (MISCELLANEOUS) ×2
COVER BURR HOLE 7 (Orthopedic Implant) IMPLANT
COVER MAYO STAND STRL (DRAPES) IMPLANT
COVERAGE SUPPORT O-ARM STEALTH (MISCELLANEOUS) ×2 IMPLANT
DRAIN SUBARACHNOID (WOUND CARE) IMPLANT
DRAPE HALF SHEET 40X57 (DRAPES) ×2 IMPLANT
DRAPE INCISE IOBAN 66X45 STRL (DRAPES) IMPLANT
DRAPE MICROSCOPE SLANT 54X150 (MISCELLANEOUS) IMPLANT
DRAPE NEUROLOGICAL W/INCISE (DRAPES) ×2 IMPLANT
DRAPE STERI IOBAN 125X83 (DRAPES) IMPLANT
DRAPE SURG 17X23 STRL (DRAPES) IMPLANT
DRAPE WARM FLUID 44X44 (DRAPES) ×2 IMPLANT
DRSG ADAPTIC 3X8 NADH LF (GAUZE/BANDAGES/DRESSINGS) IMPLANT
DRSG TELFA 3X8 NADH STRL (GAUZE/BANDAGES/DRESSINGS) IMPLANT
DURAPREP 6ML APPLICATOR 50/CS (WOUND CARE) ×2 IMPLANT
ELECT REM PT RETURN 9FT ADLT (ELECTROSURGICAL) ×2
ELECTRODE REM PT RTRN 9FT ADLT (ELECTROSURGICAL) ×2 IMPLANT
EVACUATOR 1/8 PVC DRAIN (DRAIN) IMPLANT
EVACUATOR SILICONE 100CC (DRAIN) IMPLANT
FEE COVERAGE SUPPORT O-ARM (MISCELLANEOUS) IMPLANT
FORCEPS BIPOLAR SPETZLER 8 1.0 (NEUROSURGERY SUPPLIES) ×2 IMPLANT
GAUZE 4X4 16PLY ~~LOC~~+RFID DBL (SPONGE) IMPLANT
GAUZE SPONGE 4X4 12PLY STRL (GAUZE/BANDAGES/DRESSINGS) ×2 IMPLANT
GLOVE BIO SURGEON STRL SZ7.5 (GLOVE) IMPLANT
GLOVE BIOGEL PI IND STRL 7.0 (GLOVE) IMPLANT
GLOVE BIOGEL PI IND STRL 7.5 (GLOVE) ×4 IMPLANT
GLOVE ECLIPSE 7.0 STRL STRAW (GLOVE) ×4 IMPLANT
GLOVE EXAM NITRILE XL STR (GLOVE) IMPLANT
GOWN STRL REUS W/ TWL LRG LVL3 (GOWN DISPOSABLE) ×4 IMPLANT
GOWN STRL REUS W/ TWL XL LVL3 (GOWN DISPOSABLE) IMPLANT
GOWN STRL REUS W/TWL 2XL LVL3 (GOWN DISPOSABLE) IMPLANT
GOWN STRL REUS W/TWL LRG LVL3 (GOWN DISPOSABLE) ×4
GOWN STRL REUS W/TWL XL LVL3 (GOWN DISPOSABLE)
GRAFT DURAGEN MATRIX 3WX3L (Graft) ×2 IMPLANT
GRAFT DURAGEN MATRIX 3X3 SNGL (Graft) IMPLANT
HEMOSTAT POWDER KIT SURGIFOAM (HEMOSTASIS) ×2 IMPLANT
HEMOSTAT SURGICEL 2X14 (HEMOSTASIS) ×2 IMPLANT
HOOK DURA 1/2IN (MISCELLANEOUS) ×2 IMPLANT
IV NS 1000ML (IV SOLUTION)
IV NS 1000ML BAXH (IV SOLUTION) ×2 IMPLANT
KIT BASIN OR (CUSTOM PROCEDURE TRAY) ×2 IMPLANT
KIT DRAIN CSF ACCUDRAIN (MISCELLANEOUS) IMPLANT
KIT TURNOVER KIT B (KITS) ×2 IMPLANT
KNIFE ARACHNOID DISP AM-24-S (MISCELLANEOUS) ×2 IMPLANT
MARKER SPHERE PSV REFLC NDI (MISCELLANEOUS) ×6 IMPLANT
NDL SPNL 18GX3.5 QUINCKE PK (NEEDLE) IMPLANT
NEEDLE HYPO 22GX1.5 SAFETY (NEEDLE) ×2 IMPLANT
NEEDLE SPNL 18GX3.5 QUINCKE PK (NEEDLE) IMPLANT
NS IRRIG 1000ML POUR BTL (IV SOLUTION) ×6 IMPLANT
PACK BATTERY CMF DISP FOR DVR (ORTHOPEDIC DISPOSABLE SUPPLIES) IMPLANT
PACK CRANIOTOMY CUSTOM (CUSTOM PROCEDURE TRAY) ×2 IMPLANT
PATTIES SURGICAL .25X.25 (GAUZE/BANDAGES/DRESSINGS) IMPLANT
PATTIES SURGICAL .5 X.5 (GAUZE/BANDAGES/DRESSINGS) IMPLANT
PATTIES SURGICAL .5 X3 (DISPOSABLE) IMPLANT
PATTIES SURGICAL 1/4 X 3 (GAUZE/BANDAGES/DRESSINGS) IMPLANT
PATTIES SURGICAL 1X1 (DISPOSABLE) IMPLANT
PIN MAYFIELD SKULL DISP (PIN) ×2 IMPLANT
SCREW UNIII AXS SD 1.5X4 (Screw) IMPLANT
SPECIMEN JAR SMALL (MISCELLANEOUS) IMPLANT
SPIKE FLUID TRANSFER (MISCELLANEOUS) ×2 IMPLANT
SPONGE NEURO XRAY DETECT 1X3 (DISPOSABLE) IMPLANT
SPONGE SURGIFOAM ABS GEL 100 (HEMOSTASIS) ×2 IMPLANT
STAPLER VISISTAT 35W (STAPLE) ×2 IMPLANT
STOCKINETTE 6  STRL (DRAPES)
STOCKINETTE 6 STRL (DRAPES) IMPLANT
SUT ETHILON 3 0 FSL (SUTURE) IMPLANT
SUT ETHILON 3 0 PS 1 (SUTURE) IMPLANT
SUT NURALON 4 0 TR CR/8 (SUTURE) ×6 IMPLANT
SUT SILK 0 TIES 10X30 (SUTURE) IMPLANT
SUT VIC AB 0 CT1 18XCR BRD8 (SUTURE) ×4 IMPLANT
SUT VIC AB 0 CT1 8-18 (SUTURE) ×2
SUT VIC AB 3-0 SH 8-18 (SUTURE) ×4 IMPLANT
TAPE CLOTH 1X10 TAN NS (GAUZE/BANDAGES/DRESSINGS) ×2 IMPLANT
TOWEL GREEN STERILE (TOWEL DISPOSABLE) ×2 IMPLANT
TOWEL GREEN STERILE FF (TOWEL DISPOSABLE) ×2 IMPLANT
TRAY FOLEY MTR SLVR 16FR STAT (SET/KITS/TRAYS/PACK) ×2 IMPLANT
TUBE CONNECTING 12X1/4 (SUCTIONS) ×2 IMPLANT
UNDERPAD 30X36 HEAVY ABSORB (UNDERPADS AND DIAPERS) ×2 IMPLANT
WATER STERILE IRR 1000ML POUR (IV SOLUTION) ×2 IMPLANT

## 2021-10-30 NOTE — H&P (Signed)
  Chief Complaint   Meningioma  History of Present Illness  Summer King is a 41 y.o. female initially seen in the outpatient neurosurgery clinic after presenting to the emergency department with left-sided transient weakness and apraxia.  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  There is associated mass effect and edema.  She therefore presents for elective resection of the tumor.  Past Medical History   Past Medical History:  Diagnosis Date   Hypertension     Past Surgical History   Past Surgical History:  Procedure Laterality Date   MANDIBLE FRACTURE SURGERY  4th grade   WISDOM TOOTH EXTRACTION      Social History   Social History   Tobacco Use   Smoking status: Every Day    Types: Cigarettes   Smokeless tobacco: Current   Tobacco comments:    contemplating, interested in Chantix (04/2015)  Vaping Use   Vaping Use: Some days  Substance Use Topics   Alcohol use: Yes    Alcohol/week: 2.0 - 3.0 standard drinks of alcohol    Types: 2 - 3 Standard drinks or equivalent per week   Drug use: No    Medications   Prior to Admission medications   Medication Sig Start Date End Date Taking? Authorizing Provider  levETIRAcetam (KEPPRA) 500 MG tablet Take 1 tablet (500 mg total) by mouth 2 (two) times daily. 10/15/21  Yes Vaslow, Acey Lav, MD  levonorgestrel (MIRENA) 20 MCG/24HR IUD 1 each by Intrauterine route once.   Yes [provider]  lisinopril (ZESTRIL) 10 MG tablet Take 10 mg by mouth daily. 08/25/21  Yes [provider]    Allergies   Allergies  Allergen Reactions   Codeine Hives   Erythromycin Hives   Griseofulvin Ultramicrosize [Griseofulvin] Hives    tramadol   Hydrocodone Hives   Penicillins    Scopolamine Hives    Review of Systems  ROS  Neurologic Exam  Awake, alert, oriented Memory and concentration grossly intact Speech fluent, appropriate CN grossly intact Motor exam: Upper Extremities Deltoid  Bicep Tricep Grip  Right 5/5 5/5 5/5 5/5  Left 5/5 5/5 5/5 5/5   Lower Extremities IP Quad PF DF EHL  Right 5/5 5/5 5/5 5/5 5/5  Left 5/5 5/5 5/5 5/5 5/5   Sensation grossly intact to LT  Imaging  MRI of the brain with and without contrast was again personally reviewed and demonstrates a homogeneously enhancing mass which appears to be dural based arising from the right parietal convexity.  There is a mild amount of subjacent brain edema.  There is significant local mass effect with minimal midline shift.  There is minimal effacement of the right lateral ventricle.  Impression  - 41 y.o. female with symptomatic right parietal convexity likely meningioma which will require surgical resection.  Plan  -We will plan on proceeding with stereotactic right parietal craniotomy for resection of the meningioma  I have reviewed in detail the indications for surgery as well as the details of the surgery itself and the expected postoperative course and recovery.  We have also reviewed the associated risks, benefits, and alternatives to surgery.  All her questions today were answered and she provided informed consent to proceed.   Consuella Lose, MD [facility]

## 2021-10-30 NOTE — Op Note (Signed)
NEUROSURGERY OPERATIVE NOTE   PREOP DIAGNOSIS:  Right parietal meningioma   POSTOP DIAGNOSIS: Same  PROCEDURE: Stereotactic right parietal craniotomy for resection of meningioma  SURGEON: Dr. Consuella Lose, MD  ASSISTANT: Dr. Ashok Pall, MD  ANESTHESIA: General Endotracheal  EBL: 100cc  SPECIMENS: Right parietal tumor  DRAINS: None  COMPLICATIONS: None immediate  CONDITION: Hemodynamically stable to PACU  HISTORY: Summer King is a 41 y.o. female who initially presented to the emergency department with primarily left arm subjective weakness, clumsiness, and apraxia.  Her symptoms were transient.  She was started on some Keppra.  Work-up included CT scan and MRI which ultimately revealed a right parietal dural based enhancing lesion consistent with meningioma.  There was associated brain edema and local mass effect, with a small amount of midline shift.  Surgical resection was therefore recommended.  The risks, benefits, and alternatives to surgery were all reviewed in detail with the patient and her family.  After all questions were answered informed consent was obtained and witnessed.  PROCEDURE IN DETAIL: The patient was brought to the operating room. After induction of general anesthesia, the patient was positioned on the operative table in the [place] head holder in the supine position.  A large right-sided shoulder roll was placed as well as a left-sided axillary roll.  This allowed exposure of the posterior aspect of the right parietal region.  All pressure points were meticulously padded.  Utilizing the stereotactic preoperative MRI scan, surface markers were coregistered until a satisfactory accuracy was achieved.  Stereotactic system was then used to mark out a curvilinear skin incision which would allow access to the borders of the underlying meningioma.  Skin incision was then marked out and prepped and draped in the usual sterile fashion.  After timeout was  conducted, the planned skin incision was infiltrated with local anesthetic with epinephrine.  Incision was then made sharply and carried down through the galea.  Raney clips were applied for hemostasis.  The Bovie electrocautery was used to incise the periosteum.  Elevator was then used to raise a single piece myocutaneous flap which was reflected anteriorly.  Stereotactic system was then used to plan out multiple bur holes around the periphery of the underlying tumor.  This included a bur hole line between the superior sagittal sinus and the medial margin of the tumor.  Bur holes were then connected with the craniotome.  A single piece parietal bone flap was elevated.  Hemostasis was easily secured with bipolar electrocautery.  The dura was then incised along the anterior border of the exposure.  Dura was then opened along the border of the craniotomy flap around the periphery of the tumor.  As the dura was reflected anteriorly, the tumor was noted to have a very good arachnoid plane.  It was densely adherent to the dura and as the dura was flapped anteriorly, the tumor easily delivered itself from the underlying brain.  The dura was therefore truncated anteriorly and the entire tumor with the surrounding dura was sent as a single specimen for permanent pathology.  At this point the wound was irrigated with a copious amount of normal saline irrigation.  No active bleeding on the brain surface was identified.  Hemostasis on the dural edges was achieved with bipolar electrocautery.  A collagen dural onlay graft was placed.  The bone flap was then replaced and plated with standard titanium plates and screws.  The wound was again irrigated with normal saline.  The galea was then reapproximated with a  combination of interrupted 0 and 3-0 Vicryl stitches.  The skin was closed with staples.  Bacitracin ointment and a sterile dressing was applied.  Patient was removed from the [place] head holder.  At the end of the  case all sponge, needle, instrument, and cottonoid counts were correct.  Patient was then extubated and taken to the postanesthesia care unit in stable hemodynamic condition.   Consuella Lose, MD [facility]

## 2021-10-30 NOTE — Anesthesia Preprocedure Evaluation (Addendum)
Anesthesia Evaluation  Patient identified by MRN, date of birth, ID band Patient awake    Reviewed: Allergy & Precautions, NPO status , Patient's Chart, lab work & pertinent test results  Airway Mallampati: II  TM Distance: >3 FB Neck ROM: Full    Dental  (+) Teeth Intact, Dental Advisory Given   Pulmonary Current Smoker and Patient abstained from smoking.,    breath sounds clear to auscultation       Cardiovascular hypertension,  Rhythm:Regular Rate:Normal     Neuro/Psych Seizures -,  negative psych ROS   GI/Hepatic negative GI ROS, Neg liver ROS,   Endo/Other  negative endocrine ROS  Renal/GU negative Renal ROS     Musculoskeletal negative musculoskeletal ROS (+)   Abdominal Normal abdominal exam  (+)   Peds  Hematology negative hematology ROS (+)   Anesthesia Other Findings   Reproductive/Obstetrics                            Anesthesia Physical Anesthesia Plan  ASA: 2  Anesthesia Plan: General   Post-op Pain Management:    Induction: Intravenous  PONV Risk Score and Plan: 3 and Ondansetron, Dexamethasone and Treatment may vary due to age or medical condition  Airway Management Planned: Oral ETT  Additional Equipment: Arterial line  Intra-op Plan:   Post-operative Plan: Extubation in OR  Informed Consent: I have reviewed the patients History and Physical, chart, labs and discussed the procedure including the risks, benefits and alternatives for the proposed anesthesia with the patient or authorized representative who has indicated his/her understanding and acceptance.     Dental advisory given  Plan Discussed with: CRNA  Anesthesia Plan Comments: (- Remi gtt)       Anesthesia Quick Evaluation

## 2021-10-30 NOTE — Progress Notes (Signed)
Pharmacy Antibiotic Note  Summer King is a 41 y.o. female admitted on 10/30/2021 with surgical prophylaxis.  Pharmacy has been consulted for Vancomycin dosing. Pre-op dose 1gm received ~1330. No drain noted.  Plan: Vancomycin 1gm IV at 0100 (~12h post pre-op dose)  Pharmacy will sign off  Height: '5\' 1"'$  (154.9 cm) Weight: 73 kg (161 lb) IBW/kg (Calculated) : 47.8  Temp (24hrs), Avg:98.3 F (36.8 C), Min:98 F (36.7 C), Max:98.5 F (36.9 C)  No results for input(s): "WBC", "CREATININE", "LATICACIDVEN", "VANCOTROUGH", "VANCOPEAK", "VANCORANDOM", "GENTTROUGH", "GENTPEAK", "GENTRANDOM", "TOBRATROUGH", "TOBRAPEAK", "TOBRARND", "AMIKACINPEAK", "AMIKACINTROU", "AMIKACIN" in the last 168 hours.  Estimated Creatinine Clearance: 82.9 mL/min (by C-G formula based on SCr of 0.71 mg/dL).    Allergies  Allergen Reactions   Codeine Hives   Erythromycin Hives   Griseofulvin Ultramicrosize [Griseofulvin] Hives    tramadol   Hydrocodone Hives   Penicillins    Scopolamine Hives   Tramadol Hives and Itching     Thank you for allowing pharmacy to be a part of this patient's care.  Sherlon Handing, PharmD, BCPS Please see amion for complete clinical pharmacist phone list 10/30/2021 6:50 PM

## 2021-10-30 NOTE — Transfer of Care (Signed)
Immediate Anesthesia Transfer of Care Note  Patient: Summer King  Procedure(s) Performed: STEREOTACTIC RT, PARIETAL CRANI FOR RESECTION OF TUMOR (Right: Head) APPLICATION OF CRANIAL NAVIGATION (Right)  Patient Location: PACU  Anesthesia Type:General  Level of Consciousness: awake, alert  and oriented  Airway & Oxygen Therapy: Patient Spontanous Breathing  Post-op Assessment: Report given to RN  Post vital signs: Reviewed and stable  Last Vitals:  Vitals Value Taken Time  BP 134/87   Temp    Pulse 115   Resp 19   SpO2 99     Last Pain:  Vitals:   10/30/21 1150  TempSrc:   PainSc: 0-No pain         Complications: No notable events documented.

## 2021-10-30 NOTE — Anesthesia Procedure Notes (Signed)
Arterial Line Insertion Start/End9/29/2023 2:10 PM, 10/30/2021 2:15 PM Performed by: Effie Berkshire, MD, anesthesiologist  Patient location: Pre-op. Preanesthetic checklist: patient identified, IV checked, site marked, risks and benefits discussed, surgical consent, monitors and equipment checked, pre-op evaluation, timeout performed and anesthesia consent Lidocaine 1% used for infiltration Left, radial was placed Catheter size: 20 G Hand hygiene performed  and maximum sterile barriers used   Attempts: 1 Procedure performed without using ultrasound guided technique. Following insertion, dressing applied and Biopatch. Post procedure assessment: normal and unchanged  Patient tolerated the procedure well with no immediate complications.

## 2021-10-30 NOTE — Anesthesia Procedure Notes (Signed)
Procedure Name: Intubation Date/Time: 10/30/2021 2:48 PM  Performed by: Barrington Ellison, CRNAPre-anesthesia Checklist: Patient identified, Emergency Drugs available, Suction available and Patient being monitored Patient Re-evaluated:Patient Re-evaluated prior to induction Oxygen Delivery Method: Circle System Utilized Preoxygenation: Pre-oxygenation with 100% oxygen Induction Type: IV induction Ventilation: Mask ventilation without difficulty Laryngoscope Size: Mac and 3 Grade View: Grade I Tube type: Oral Tube size: 7.0 mm Number of attempts: 1 Airway Equipment and Method: Stylet and Oral airway Placement Confirmation: ETT inserted through vocal cords under direct vision, positive ETCO2 and breath sounds checked- equal and bilateral Secured at: 21 cm Tube secured with: Tape Dental Injury: Teeth and Oropharynx as per pre-operative assessment

## 2021-10-31 ENCOUNTER — Inpatient Hospital Stay (HOSPITAL_COMMUNITY): Payer: 59

## 2021-10-31 LAB — MRSA NEXT GEN BY PCR, NASAL: MRSA by PCR Next Gen: NOT DETECTED

## 2021-10-31 MED ORDER — GADOPICLENOL 0.5 MMOL/ML IV SOLN
7.5000 mL | Freq: Once | INTRAVENOUS | Status: AC | PRN
  Administered 2021-10-31: 7.5 mL via INTRAVENOUS

## 2021-10-31 MED ORDER — PANTOPRAZOLE SODIUM 40 MG PO TBEC: 40.0000 mg | DELAYED_RELEASE_TABLET | Freq: Every day | ORAL | Status: DC

## 2021-10-31 NOTE — Anesthesia Postprocedure Evaluation (Signed)
Anesthesia Post Note  Patient: Summer King  Procedure(s) Performed: STEREOTACTIC RT, PARIETAL CRANI FOR RESECTION OF TUMOR (Right: Head) APPLICATION OF CRANIAL NAVIGATION (Right)     Patient location during evaluation: PACU Anesthesia Type: General Level of consciousness: awake and alert Pain management: pain level controlled Vital Signs Assessment: post-procedure vital signs reviewed and stable Respiratory status: spontaneous breathing, nonlabored ventilation, respiratory function stable and patient connected to nasal cannula oxygen Cardiovascular status: blood pressure returned to baseline and stable Postop Assessment: no apparent nausea or vomiting Anesthetic complications: no   No notable events documented.  Last Vitals:  Vitals:   10/31/21 0600 10/31/21 0700  BP: 121/81 102/65  Pulse: 98 87  Resp: (!) 26 (!) 21  Temp:  36.8 C  SpO2: 94% 96%    Last Pain:  Vitals:   10/31/21 0700  TempSrc: Oral  PainSc:                  Effie Berkshire

## 2021-10-31 NOTE — Discharge Instructions (Addendum)
Craniotomy Care After Please read the instructions outlined below and refer to this sheet in the next few weeks. These discharge instructions provide you with general information on caring for yourself after you leave the hospital. Your surgeon may also give you specific instructions. While your treatment has been planned according to the most current medical practices available, unavoidable complications occasionally occur. If you have any problems or questions after discharge, please call your surgeon. Although there are many types of brain surgery, recovery following craniotomy (surgical opening of the skull) is much the same for each. However, recovery depends on many factors. These include the type and severity of brain injury and the type of surgery. It also depends on any nervous system function problems (neurological deficits) before surgery. If the craniotomy was done for cancer, chemotherapy and radiation could follow. You could be in the hospital from 5 days to a couple weeks. This depends on the type of surgery, findings, and whether there are complications. HOME CARE INSTRUCTIONS  It is not unusual to hear a clicking noise after a craniotomy, the plates and screws used to attach the bone flap can sometimes cause this. It is a normal occurrence if this does happen Do not drive for 10 days after the operation Your scalp may feel spongy for a while, because of fluid under it. This will gradually get better. Occasionally, the surgeon will not replace the bone that was removed to access the brain. If there is a bony defect, the surgeon will ask you to wear a helmet for protection. This is a discussion you should have with your surgeon prior to leaving the hospital (discharge). Numbness may persist in some areas of your scalp. Take all medications as directed. Sometimes steroids to control swelling are prescribed. Anticonvulsants to prevent seizures may also be given. Do not use alcohol, other drugs,  or medications unless your surgeon says it is OK. Keep the wound dry and clean. The wound may be washed gently with soap and water. Then, you may gently blot or dab it dry, without rubbing. Do not take baths, use swimming pools or hot tubs for 10 days, or as instructed by your caregiver. It is best to wait to see you surgeon at your first postoperative visit, and to get directions at that time. Only take over-the-counter or prescription medicines for pain, discomfort, or fever as directed by your caregiver. You may continue your normal diet, as directed. Walking is OK for exercise. Wait at least 3 months before you return to mild, non-contact sports or as your surgeon suggests. Contact sports should be avoided for at least 1 year, unless your surgeon says it is OK. If you are prescribed steroids, take them exactly as prescribed. If you start having a decrease in nervous system functions (neurological deficits) and headaches as the dose of steroids is reduced, tell your surgeon right away. Take '4mg'$  decadron for no longer than 4 days When the anticonvulsant prescription is finished you no longer need to take it. May take tylenol as needed for pain per bottle instructions SEEK IMMEDIATE MEDICAL CARE IF:  You develop nausea, vomiting, severe headaches, confusion, or you have a seizure. You develop chest pain, a stiff neck, or difficulty breathing. There is redness, swelling, or increasing pain in the wound or pin insertion sites. You have an increase in swelling or bruising around the eyes. There is drainage or pus coming from the wound. You have an oral temperature above 102 F (38.9 C), not controlled by  medicine. You notice a foul smell coming from the wound or dressing. The wound breaks open (edges not staying together) after the stitches have been removed. You develop dizziness or fainting while standing. You develop a rash. You develop any reaction or side effects to the medications  given. Document Released: 04/20/2005 Document Revised: 04/12/2011 Document Reviewed: 01/27/2009 [facility] Patient Information 2013 [place].

## 2021-10-31 NOTE — Progress Notes (Signed)
Pt d/c to home by car with family. Assessment stable. D/C instructions reviewed. All questions answered.

## 2021-10-31 NOTE — Discharge Summary (Signed)
Physician Discharge Summary  Patient ID: Summer King MRN: [id number] DOB/AGE: 05/16/1980 41 y.o.  Admit date: 10/30/2021 Discharge date: 10/31/2021  Admission Diagnoses:Meningioma  Discharge Diagnoses:  Principal Problem:   Meningioma [facility])   Discharged Condition: good  Hospital Course: Summer King was admitted and taken to the operating room for an uncomplicated craniotomy and right parietal tumor resection. Post op her scan shows total resection. She has a normal neurological examination at discharge.wound is clean, dry, without signs of infection.   Treatments: surgery: as above  Discharge Exam: Blood pressure 129/85, pulse (!) 101, temperature 98.5 F (36.9 C), temperature source Oral, resp. rate 20, height '5\' 1"'$  (1.549 m), weight 73 kg, SpO2 97 %. General appearance: alert, appears stated age, and no distress  Disposition: Discharge disposition: 01-Home or Self Care      MENINGIOMA  Allergies as of 10/31/2021       Reactions   Codeine Hives   Erythromycin Hives   Griseofulvin Ultramicrosize [griseofulvin] Hives   tramadol   Hydrocodone Hives   Penicillins    Scopolamine Hives   Tramadol Hives, Itching        Medication List     TAKE these medications    levETIRAcetam 500 MG tablet Commonly known as: KEPPRA Take 1 tablet (500 mg total) by mouth 2 (two) times daily.   levonorgestrel 20 MCG/24HR IUD Commonly known as: MIRENA 1 each by Intrauterine route once.   lisinopril 10 MG tablet Commonly known as: ZESTRIL Take 10 mg by mouth daily.        Follow-up Information     Consuella Lose, MD Follow up.   Specialty: Neurosurgery Why: 10 days for suture removal, please call the office Contact information: [address] [place] [address] [phone]                 Signed: Ashok Pall 10/31/2021, 12:36 PM

## 2021-11-02 ENCOUNTER — Encounter (HOSPITAL_COMMUNITY): Payer: Self-pay | Admitting: Neurosurgery

## 2021-11-07 ENCOUNTER — Encounter: Payer: Self-pay | Admitting: Internal Medicine

## 2021-11-09 ENCOUNTER — Telehealth: Payer: Self-pay | Admitting: *Deleted

## 2021-11-09 ENCOUNTER — Inpatient Hospital Stay: Payer: 59

## 2021-11-09 LAB — SURGICAL PATHOLOGY

## 2021-11-09 NOTE — Telephone Encounter (Signed)
Prepared letter for patient to employer stating she is under driving restrictions due to seizure disorder.

## 2021-11-10 NOTE — Telephone Encounter (Signed)
Mailed letter to patients home address on file.

## 2021-11-16 ENCOUNTER — Inpatient Hospital Stay: Payer: 59 | Attending: Hematology and Oncology

## 2021-12-14 ENCOUNTER — Encounter: Payer: Self-pay | Admitting: Hematology and Oncology

## 2021-12-15 ENCOUNTER — Inpatient Hospital Stay: Payer: 59 | Attending: Hematology and Oncology | Admitting: Internal Medicine

## 2021-12-15 ENCOUNTER — Telehealth: Payer: Self-pay | Admitting: Internal Medicine

## 2021-12-15 ENCOUNTER — Inpatient Hospital Stay: Payer: 59

## 2021-12-15 ENCOUNTER — Inpatient Hospital Stay: Payer: 59 | Admitting: Hematology and Oncology

## 2021-12-15 ENCOUNTER — Other Ambulatory Visit: Payer: Self-pay

## 2021-12-15 VITALS — BP 132/82 | HR 95 | Temp 98.6°F | Resp 18 | Ht 63.0 in | Wt 163.3 lb

## 2021-12-15 DIAGNOSIS — R569 Unspecified convulsions: Secondary | ICD-10-CM | POA: Diagnosis not present

## 2021-12-15 DIAGNOSIS — D329 Benign neoplasm of meninges, unspecified: Secondary | ICD-10-CM | POA: Diagnosis not present

## 2021-12-15 DIAGNOSIS — Z79899 Other long term (current) drug therapy: Secondary | ICD-10-CM | POA: Insufficient documentation

## 2021-12-15 DIAGNOSIS — F17210 Nicotine dependence, cigarettes, uncomplicated: Secondary | ICD-10-CM | POA: Diagnosis not present

## 2021-12-15 NOTE — Telephone Encounter (Signed)
Per 11/14 los called pt with no answer and no voicemail .  Pt is mychart active

## 2021-12-15 NOTE — Progress Notes (Signed)
[place] at [place], [place] [address] [phone]   Interval Evaluation  Date of Service: 12/15/21 Patient Name: Summer King Patient MRN: [id number] Patient DOB: [birth year] Provider: Ventura Sellers, MD  Identifying Statement:  Summer King is a 41 y.o. female with right frontal meningioma   Oncologic History: 10/30/21: Craniotomy, resection of R parietal mass (Nundkumar).  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 Kathyrn Sheriff.  She has been doing well since the procedure, no new or progressive changes.  Denies seizures, continues on the [place] as prior.   H+P (10/03/21) presented to medical attention this past month, with complaint of "woke up with left arm weakness and confusion".  She had gone to bed the night before in normal state of health.  Upon awakening, she could not use her left hand and was "staring and foggy" for several hours.  Returned to baseline by the afternoon.  CNS imaging demonstrated enhancing dural based right parietal mass.  She was discharged from ED with decadron which has now been stopped.  Today complains of anxiety, no further seizures.  Medications: Current Outpatient Medications on File Prior to Visit  Medication Sig Dispense Refill   levETIRAcetam (KEPPRA) 500 MG tablet Take 1 tablet (500 mg total) by mouth 2 (two) times daily. 60 tablet 3   levonorgestrel (MIRENA) 20 MCG/24HR IUD 1 each by Intrauterine route once.     lisinopril (ZESTRIL) 10 MG tablet Take 10 mg by mouth daily.     No current facility-administered medications on file prior to visit.    Allergies:  Allergies  Allergen Reactions   Codeine Hives   Erythromycin Hives   Griseofulvin Ultramicrosize [Griseofulvin] Hives    tramadol   Hydrocodone Hives   Penicillins    Scopolamine Hives   Tramadol Hives and Itching   Past Medical History:  Past  Medical History:  Diagnosis Date   Hypertension    Past Surgical History:  Past Surgical History:  Procedure Laterality Date   APPLICATION OF CRANIAL NAVIGATION Right 10/30/2021   Procedure: APPLICATION OF CRANIAL NAVIGATION;  Surgeon: Consuella Lose, MD;  Location: [place];  Service: Neurosurgery;  Laterality: Right;   CRANIOTOMY Right 10/30/2021   Procedure: STEREOTACTIC RT, PARIETAL CRANI FOR RESECTION OF TUMOR;  Surgeon: Consuella Lose, MD;  Location: [place];  Service: Neurosurgery;  Laterality: Right;   MANDIBLE FRACTURE SURGERY  4th grade   WISDOM TOOTH EXTRACTION     Social History:  Social History   Socioeconomic History   Marital status: Married    Spouse name: Not on file   Number of children: 1   Years of education: 12+   Highest education level: Not on file  Occupational History   Occupation: computer/office work  Tobacco Use   Smoking status: Every Day    Types: Cigarettes   Smokeless tobacco: Current   Tobacco comments:    contemplating, interested in Chantix (04/2015)  Vaping Use   Vaping Use: Some days  Substance and Sexual Activity   Alcohol use: Yes    Alcohol/week: 2.0 - 3.0 standard drinks of alcohol    Types: 2 - 3 Standard drinks or equivalent per week   Drug use: No   Sexual activity: Yes  Other Topics Concern   Not on file  Social History Narrative   Lives with her husband and son.   Social Determinants of Health  Financial Resource Strain: Not on file  Food Insecurity: Not on file  Transportation Needs: Not on file  Physical Activity: Not on file  Stress: Not on file  Social Connections: Not on file  Intimate Partner Violence: Not on file   Family History:  Family History  Problem Relation Age of Onset   Heart disease Father    Arthritis Mother        rheumatoid    Review of Systems: Constitutional: Doesn't report fevers, chills or abnormal weight loss Eyes: Doesn't report blurriness of vision Ears, nose, mouth, throat, and  face: Doesn't report sore throat Respiratory: Doesn't report cough, dyspnea or wheezes Cardiovascular: Doesn't report palpitation, chest discomfort  Gastrointestinal:  Doesn't report nausea, constipation, diarrhea GU: Doesn't report incontinence Skin: Doesn't report skin rashes Neurological: Per HPI Musculoskeletal: Doesn't report joint pain Behavioral/Psych: Doesn't report anxiety  Physical Exam: There were no vitals filed for this visit.  KPS: 90. General: Alert, cooperative, pleasant, in no acute distress Head: Normal EENT: No conjunctival injection or scleral icterus.  Lungs: Resp effort normal Cardiac: Regular rate Abdomen: Non-distended abdomen Skin: No rashes cyanosis or petechiae. Extremities: No clubbing or edema  Neurologic Exam: Mental Status: Awake, alert, attentive to examiner. Oriented to self and environment. Language is fluent with intact comprehension.  Cranial Nerves: Visual acuity is grossly normal. Visual fields are full. Extra-ocular movements intact. No ptosis. Face is symmetric Motor: Tone and bulk are normal. Power is full in both arms and legs. Reflexes are symmetric, no pathologic reflexes present.  Sensory: Intact to light touch Gait: Normal.   Labs: I have reviewed the data as listed    Component Value Date/Time   NA 136 10/23/2021 1112   K 4.2 10/23/2021 1112   CL 106 10/23/2021 1112   CO2 22 10/23/2021 1112   GLUCOSE 108 (H) 10/23/2021 1112   BUN 9 10/23/2021 1112   CREATININE 0.71 10/23/2021 1112   CALCIUM 9.2 10/23/2021 1112   PROT 7.8 09/22/2021 0919   ALBUMIN 3.9 09/22/2021 0919   AST 31 09/22/2021 0919   ALT 30 09/22/2021 0919   ALKPHOS 123 09/22/2021 0919   BILITOT 0.3 09/22/2021 0919   GFRNONAA >60 10/23/2021 1112   GFRAA  11/25/2008 0753    >60        The eGFR has been calculated using the MDRD equation. This calculation has not been validated in all clinical situations. eGFR's persistently <60 mL/min signify possible  Chronic Kidney Disease.   Lab Results  Component Value Date   WBC 9.1 10/23/2021   NEUTROABS 15.1 (H) 09/25/2021   HGB 16.1 (H) 10/23/2021   HCT 45.5 10/23/2021   MCV 100.2 (H) 10/23/2021   PLT 347 10/23/2021   Imaging:  CLINICAL DATA:  Mass resection   EXAM: MRI HEAD WITHOUT AND WITH CONTRAST   TECHNIQUE: Multiplanar, multiecho pulse sequences of the brain and surrounding structures were obtained without and with intravenous contrast.   CONTRAST:  7.5 cc of Vueway IV   COMPARISON:  10/04/2021   FINDINGS: Brain: Resected dural based mass from the right parietal region. No residual tumor is seen, only brain surface vessels and mild dural thickening. Fluid, gas, and blood products occupy space previously occupied by the mass, adjacent brain edema is diminished. No complicating infarct, unexpected blood products, or midline shift. No hydrocephalus.   Vascular: Major vessels are enhancing.   Skull and upper cervical spine: Unremarkable right parietal craniotomy   Sinuses/Orbits: Negative   IMPRESSION: Interval resection of presumed right  parietal meningioma. No complicating features or evidence of residual.     Electronically Signed   By: Jorje Guild M.D.   On: 10/31/2021 04:39  Assessment/Plan Meningioma ([place])  Focal seizure ([place])  Summer King is clinically stable following grade 1 meningioma gross total resection.  No further seizures.  Will con't Keppra 559m BID, she is agreeable with this.  Driving restriction in place until March 2024 assuming no further events.  We ask that NMahreen Schewereturn to clinic in 4 months with MRI brain for review, or sooner as needed.  All questions were answered. The patient knows to call the clinic with any problems, questions or concerns. No barriers to learning were detected.  The total time spent in the encounter was 30 minutes and more than 50% was on counseling and review of test results   ZVentura Sellers  MD Medical Director of Neuro-Oncology [facility] [place]11/14/23 11:04 AM

## 2022-02-16 ENCOUNTER — Other Ambulatory Visit: Payer: Self-pay | Admitting: Internal Medicine

## 2022-03-31 ENCOUNTER — Encounter: Payer: Self-pay | Admitting: Internal Medicine

## 2022-04-29 ENCOUNTER — Ambulatory Visit (HOSPITAL_COMMUNITY)
Admission: RE | Admit: 2022-04-29 | Discharge: 2022-04-29 | Disposition: A | Discharge status: Home / Self Care | Payer: 59 | Source: Ambulatory Visit | Attending: Internal Medicine | Admitting: Internal Medicine

## 2022-04-29 DIAGNOSIS — D329 Benign neoplasm of meninges, unspecified: Secondary | ICD-10-CM | POA: Insufficient documentation

## 2022-04-29 MED ORDER — GADOBUTROL 1 MMOL/ML IV SOLN
7.0000 mL | Freq: Once | INTRAVENOUS | Status: AC | PRN
  Administered 2022-04-29: 7 mL via INTRAVENOUS

## 2022-05-04 ENCOUNTER — Inpatient Hospital Stay: Payer: 59 | Attending: Internal Medicine | Admitting: Internal Medicine

## 2022-05-04 ENCOUNTER — Telehealth: Payer: Self-pay | Admitting: Internal Medicine

## 2022-05-04 ENCOUNTER — Other Ambulatory Visit: Payer: Self-pay

## 2022-05-04 VITALS — BP 156/97 | HR 93 | Temp 98.4°F | Resp 13 | Wt 166.9 lb

## 2022-05-04 DIAGNOSIS — D329 Benign neoplasm of meninges, unspecified: Secondary | ICD-10-CM | POA: Diagnosis not present

## 2022-05-04 DIAGNOSIS — D320 Benign neoplasm of cerebral meninges: Secondary | ICD-10-CM | POA: Insufficient documentation

## 2022-05-04 DIAGNOSIS — R569 Unspecified convulsions: Secondary | ICD-10-CM

## 2022-05-04 MED ORDER — LEVETIRACETAM 250 MG PO TABS: 250.0000 mg | ORAL_TABLET | Freq: Two times a day (BID) | ORAL | 2 refills | Status: DC

## 2022-05-04 NOTE — Telephone Encounter (Signed)
Reached out to patient to schedule per 4/2 LOS, left voicemail.

## 2022-05-04 NOTE — Progress Notes (Signed)
[place] at [place], [place] [address] [phone]   Interval Evaluation  Date of Service: 05/04/22 Patient Name: Summer King Patient MRN: [id number] Patient DOB: 1980/08/13 Provider: Ventura Sellers, MD  Identifying Statement:  Summer King is a 42 y.o. female with right frontal meningioma   Oncologic History 10/30/21: Craniotomy, resection of R parietal mass (Nundkumar).  Path is WHO 1 meningioma  Interval History: Summer King presents today for follow up, after recent MRI brain.  She has been doing well without further changes.  Denies seizures, continues on the Keppra 500mg  BID as prior.   H+P (10/03/21) presented to medical attention this past month, with complaint of "woke up with left arm weakness and confusion".  She had gone to bed the night before in normal state of health.  Upon awakening, she could not use her left hand and was "staring and foggy" for several hours.  Returned to baseline by the afternoon.  CNS imaging demonstrated enhancing dural based right parietal mass.  She was discharged from ED with decadron which has now been stopped.  Today complains of anxiety, no further seizures.  Medications: Current Outpatient Medications on File Prior to Visit  Medication Sig Dispense Refill   carvedilol (COREG) 25 MG tablet 1 tablet with food Orally Twice a day for 30 days     levETIRAcetam (KEPPRA) 500 MG tablet TAKE 1 TABLET(500 MG) BY MOUTH TWICE DAILY 60 tablet 3   levonorgestrel (MIRENA) 20 MCG/24HR IUD 1 each by Intrauterine route once.     lisinopril (ZESTRIL) 10 MG tablet Take 10 mg by mouth daily.     No current facility-administered medications on file prior to visit.    Allergies:  Allergies  Allergen Reactions   Codeine Hives   Erythromycin Hives   Griseofulvin Ultramicrosize [Griseofulvin] Hives    tramadol   Hydrocodone Hives   Penicillins    Scopolamine Hives   Tramadol Hives and Itching    Past Medical History:  Past Medical History:  Diagnosis Date   Hypertension    Past Surgical History:  Past Surgical History:  Procedure Laterality Date   APPLICATION OF CRANIAL NAVIGATION Right 10/30/2021   Procedure: APPLICATION OF CRANIAL NAVIGATION;  Surgeon: Consuella Lose, MD;  Location: [facility];  Service: Neurosurgery;  Laterality: Right;   CRANIOTOMY Right 10/30/2021   Procedure: STEREOTACTIC RT, PARIETAL CRANI FOR RESECTION OF TUMOR;  Surgeon: Consuella Lose, MD;  Location: [facility];  Service: Neurosurgery;  Laterality: Right;   MANDIBLE FRACTURE SURGERY  4th grade   WISDOM TOOTH EXTRACTION     Social History:  Social History   Socioeconomic History   Marital status: Married    Spouse name: Not on file   Number of children: 1   Years of education: 12+   Highest education level: Not on file  Occupational History   Occupation: computer/office work  Tobacco Use   Smoking status: Every Day    Types: Cigarettes   Smokeless tobacco: Current   Tobacco comments:    contemplating, interested in Chantix (04/2015)  Vaping Use   Vaping Use: Some days  Substance and Sexual Activity   Alcohol use: Yes    Alcohol/week: 2.0 - 3.0 standard drinks of alcohol    Types: 2 - 3 Standard drinks or equivalent per week   Drug use: No   Sexual activity: Yes  Other Topics Concern   Not on file  Social History Narrative   Lives with her  husband and son.   Social Determinants of Health   Financial Resource Strain: Not on file  Food Insecurity: Not on file  Transportation Needs: Not on file  Physical Activity: Not on file  Stress: Not on file  Social Connections: Not on file  Intimate Partner Violence: Not on file   Family History:  Family History  Problem Relation Age of Onset   Heart disease Father    Arthritis Mother        rheumatoid    Review of Systems: Constitutional: Doesn't report fevers, chills or abnormal weight loss Eyes: Doesn't report blurriness of  vision Ears, nose, mouth, throat, and face: Doesn't report sore throat Respiratory: Doesn't report cough, dyspnea or wheezes Cardiovascular: Doesn't report palpitation, chest discomfort  Gastrointestinal:  Doesn't report nausea, constipation, diarrhea GU: Doesn't report incontinence Skin: Doesn't report skin rashes Neurological: Per HPI Musculoskeletal: Doesn't report joint pain Behavioral/Psych: Doesn't report anxiety  Physical Exam: Vitals:   05/04/22 1145  BP: (!) 156/97  Pulse: 93  Resp: 13  Temp: 98.4 F (36.9 C)  SpO2: 97%    KPS: 90. General: Alert, cooperative, pleasant, in no acute distress Head: Normal EENT: No conjunctival injection or scleral icterus.  Lungs: Resp effort normal Cardiac: Regular rate Abdomen: Non-distended abdomen Skin: No rashes cyanosis or petechiae. Extremities: No clubbing or edema  Neurologic Exam: Mental Status: Awake, alert, attentive to examiner. Oriented to self and environment. Language is fluent with intact comprehension.  Cranial Nerves: Visual acuity is grossly normal. Visual fields are full. Extra-ocular movements intact. No ptosis. Face is symmetric Motor: Tone and bulk are normal. Power is full in both arms and legs. Reflexes are symmetric, no pathologic reflexes present.  Sensory: Intact to light touch Gait: Normal.   Labs: I have reviewed the data as listed    Component Value Date/Time   NA 136 10/23/2021 1112   K 4.2 10/23/2021 1112   CL 106 10/23/2021 1112   CO2 22 10/23/2021 1112   GLUCOSE 108 (H) 10/23/2021 1112   BUN 9 10/23/2021 1112   CREATININE 0.71 10/23/2021 1112   CALCIUM 9.2 10/23/2021 1112   PROT 7.8 09/22/2021 0919   ALBUMIN 3.9 09/22/2021 0919   AST 31 09/22/2021 0919   ALT 30 09/22/2021 0919   ALKPHOS 123 09/22/2021 0919   BILITOT 0.3 09/22/2021 0919   GFRNONAA >60 10/23/2021 1112   GFRAA  11/25/2008 0753    >60        The eGFR has been calculated using the MDRD equation. This calculation has  not been validated in all clinical situations. eGFR's persistently <60 mL/min signify possible Chronic Kidney Disease.   Lab Results  Component Value Date   WBC 9.1 10/23/2021   NEUTROABS 15.1 (H) 09/25/2021   HGB 16.1 (H) 10/23/2021   HCT 45.5 10/23/2021   MCV 100.2 (H) 10/23/2021   PLT 347 10/23/2021   Imaging:  [place] Clinician Interpretation: I have personally reviewed the CNS images as listed.  My interpretation, in the context of the patient's clinical presentation, is stable disease  MR BRAIN W WO CONTRAST  Result Date: 05/01/2022 CLINICAL DATA:  Follow-up resection of right parietal meningioma EXAM: MRI HEAD WITHOUT AND WITH CONTRAST TECHNIQUE: Multiplanar, multiecho pulse sequences of the brain and surrounding structures were obtained without and with intravenous contrast. CONTRAST:  69mL GADAVIST GADOBUTROL 1 MMOL/ML IV SOLN COMPARISON:  10/31/2021.  10/04/2021. FINDINGS: Brain: Diffusion imaging is normal. No abnormality affects the brainstem or cerebellum. Left cerebral hemispheres normal. Post resection  space underlying the right parietal craniotomy is resorbing/diminishing. Chronic gliotic change of the underlying brain parenchyma does not appear progressive. There is expected dural enhancement at the operative site but no evidence of residual or recurrent tumor enhancement. No hydrocephalus.  No extra-axial fluid collection. Vascular: Major vessels at the base of the brain show flow. Skull and upper cervical spine: Otherwise negative Sinuses/Orbits: Clear/normal Other: None IMPRESSION: Resorbing/diminishing postoperative space in the right parietal region underlying the craniotomy. No evidence of residual or recurrent tumor. Post compressive gliotic change of the underlying brain parenchyma does not appear progressive. Electronically Signed   By: Nelson Chimes M.D.   On: 05/01/2022 21:34    Assessment/Plan Meningioma  Focal seizure  Keishla Nadolny is clinically stable today.   MRI demonstrates collapase of cavity, no concerning findings.  Will decrease Keppra to 250mg  given long term seizure freedom, per patient preference.    We ask that Raquel Lemerise return to clinic in 12 months with MRI brain for review, or sooner as needed.  All questions were answered. The patient knows to call the clinic with any problems, questions or concerns. No barriers to learning were detected.  The total time spent in the encounter was 30 minutes and more than 50% was on counseling and review of test results   Ventura Sellers, MD Medical Director of Neuro-Oncology [facility] [place] 05/04/22 11:49 AM

## 2022-08-13 ENCOUNTER — Ambulatory Visit (HOSPITAL_COMMUNITY)
Admission: EM | Admit: 2022-08-13 | Discharge: 2022-08-13 | Disposition: A | Discharge status: Home / Self Care | Payer: 59 | Attending: Internal Medicine | Admitting: Internal Medicine

## 2022-08-13 ENCOUNTER — Ambulatory Visit (INDEPENDENT_AMBULATORY_CARE_PROVIDER_SITE_OTHER): Payer: 59

## 2022-08-13 ENCOUNTER — Encounter (HOSPITAL_COMMUNITY): Payer: Self-pay

## 2022-08-13 DIAGNOSIS — S2232XA Fracture of one rib, left side, initial encounter for closed fracture: Secondary | ICD-10-CM | POA: Diagnosis not present

## 2022-08-13 MED ORDER — OXYCODONE-ACETAMINOPHEN 5-325 MG PO TABS: 1.0000 | ORAL_TABLET | Freq: Four times a day (QID) | ORAL | 0 refills | Status: DC | PRN

## 2022-08-13 MED ORDER — LIDOCAINE 5 % EX PTCH: 1.0000 | MEDICATED_PATCH | CUTANEOUS | 0 refills | Status: DC

## 2022-08-13 NOTE — ED Triage Notes (Signed)
Pt states at work on Tuesday and bent over a rail to pick up a plant and all her weight was on her rt ribs and felt a few pop. States pain is worse and constant today.

## 2022-08-13 NOTE — Discharge Instructions (Signed)
Go to the ER if you have sudden worse rib pain and shortness of breath

## 2022-08-13 NOTE — ED Provider Notes (Signed)
[facility]    CSN: [id number] Arrival date & time: 08/13/22  1304      History   Chief Complaint Chief Complaint  Patient presents with   Rib Injury    HPI Summer King is a 42 y.o. female who presents with L distal anterior rib pain when she bend over a rail to pick up a plant and felt and heard a pop on this area of pain yesterday. The pain has been worse and constant today.     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      Home Medications    Prior to Admission medications   Medication Sig Start Date End Date Taking? Authorizing Provider  lidocaine (LIDODERM) 5 % Place 1 patch onto the skin daily. Remove & Discard patch within 12 hours or as directed by MD 08/13/22  Yes Rodriguez-Southworth, Nettie Elm, PA-C  oxyCODONE-acetaminophen (PERCOCET/ROXICET) 5-325 MG tablet Take 1 tablet by mouth every 6 (six) hours as needed for severe pain. 08/13/22  Yes Rodriguez-Southworth, Nettie Elm, PA-C  carvedilol (COREG) 25 MG tablet 1 tablet with food Orally Twice a day  for 30 days 03/12/22   [provider]  levETIRAcetam (KEPPRA) 250 MG tablet Take 1 tablet (250 mg total) by mouth 2 (two) times daily. 05/04/22   Henreitta Leber, MD  levonorgestrel (MIRENA) 20 MCG/24HR IUD 1 each by Intrauterine route once.    [provider]  lisinopril (ZESTRIL) 10 MG tablet Take 10 mg by mouth daily. 08/25/21   [provider]    Family History Family History  Problem Relation Age of Onset   Heart disease Father    Arthritis Mother        rheumatoid    Social History Social History   Tobacco Use   Smoking status: Every Day    Types: Cigarettes   Smokeless tobacco: Current   Tobacco comments:    contemplating, interested in Chantix (04/2015)  Vaping Use   Vaping status: Some Days  Substance Use Topics   Alcohol use: Yes    Alcohol/week: 2.0 - 3.0 standard drinks of alcohol    Types: 2 - 3 Standard drinks or equivalent per week   Drug use: No     Allergies   Codeine, Erythromycin, Griseofulvin ultramicrosize [griseofulvin], Hydrocodone, Penicillins, Scopolamine, and Tramadol   Review of Systems Review of Systems  As noted on HPI Physical Exam Triage Vital Signs ED Triage Vitals [08/13/22 1345]  Encounter Vitals Group     BP (!) 164/112     Systolic BP Percentile  Diastolic BP Percentile      Pulse Rate 83     Resp 18     Temp 98.6 F (37 C)     Temp Source Oral     SpO2 96 %     Weight      Height      Head Circumference      Peak Flow      Pain Score 4     Pain Loc      Pain Education      Exclude from Growth Chart    No data found.  Updated Vital Signs BP (!) 164/112 (BP Location: Left Arm)   Pulse 83   Temp 98.6 F (37 C) (Oral)   Resp 18   SpO2 96%   Visual Acuity Right Eye Distance:   Left Eye Distance:   Bilateral Distance:    Right Eye Near:   Left Eye Near:    Bilateral Near:     Physical Exam Vitals and nursing note reviewed.  Constitutional:      General: She is in acute  distress.     Appearance: She is obese.     Comments: Is in moderate pain  HENT:     Right Ear: External ear normal.     Left Ear: External ear normal.  Eyes:     General: No scleral icterus.    Conjunctiva/sclera: Conjunctivae normal.  Pulmonary:     Effort: Pulmonary effort is normal.     Comments: Has mild swelling on the anterior L rib border, but there is no ecchymosis or crepitations.  Chest:     Chest wall: Tenderness present.  Musculoskeletal:        General: Normal range of motion.     Cervical back: Neck supple.  Skin:    General: Skin is warm and dry.     Findings: No bruising, erythema or rash.  Neurological:     Mental Status: She is alert and oriented to person, place, and time.     Gait: Gait normal.  Psychiatric:        Mood and Affect: Mood normal.        Behavior: Behavior normal.        Thought Content: Thought content normal.        Judgment: Judgment normal.      UC Treatments / Results  Labs (all labs ordered are listed, but only abnormal results are displayed) Labs Reviewed - No data to display  EKG   Radiology DG Ribs Unilateral W/Chest Left  Result Date: 08/13/2022 CLINICAL DATA:  Left rib pain after injury. EXAM: LEFT RIBS AND CHEST - 3+ VIEW COMPARISON:  None Available. FINDINGS: Minimally displaced fracture is seen involving anterior portion of left seventh rib. There is no evidence of pneumothorax or pleural effusion. Both lungs are clear. Heart size and mediastinal contours are within normal limits. IMPRESSION: Minimally displaced left seventh rib fracture. Electronically Signed   By: Lupita Raider M.D.   On: 08/13/2022 14:26    Procedures Procedures (including critical care time)  Medications Ordered in UC Medications - No data to display  Initial Impression / Assessment and Plan / UC Course  I have reviewed the triage vital signs and the nursing notes.  Pertinent  imaging results that were available during my care of the patient  were reviewed by me and considered in my medical decision making (see chart for details).  L 7th rib fracture  She has never tried  Percocet but is willing to try this, so it was prescribed as noted and well as Lidoderm patch. She states the other opiate listed as allergy cause itching, not trouble breathing or throat closing.  I reviewed with her how to use a pillow to do deep breath exercises to prevent pneumonia, and use it she has to cough or sneeze.    Final Clinical Impressions(s) / UC Diagnoses   Final diagnoses:  Closed fracture of one rib of left side, initial encounter     Discharge Instructions      Go to the ER if you have sudden worse rib pain and shortness of breath     ED Prescriptions     Medication Sig Dispense Auth. Provider   lidocaine (LIDODERM) 5 % Place 1 patch onto the skin daily. Remove & Discard patch within 12 hours or as directed by MD 30 patch Rodriguez-Southworth, Nettie Elm, PA-C   oxyCODONE-acetaminophen (PERCOCET/ROXICET) 5-325 MG tablet Take 1 tablet by mouth every 6 (six) hours as needed for severe pain. 15 tablet Rodriguez-Southworth, Nettie Elm, PA-C      I have reviewed the PDMP during this encounter.   Garey Ham, [place] 08/13/22 1508

## 2023-01-08 ENCOUNTER — Other Ambulatory Visit: Payer: Self-pay | Admitting: Internal Medicine

## 2023-01-10 ENCOUNTER — Encounter: Payer: Self-pay | Admitting: Hematology and Oncology

## 2023-05-03 ENCOUNTER — Ambulatory Visit (HOSPITAL_COMMUNITY)
Admission: RE | Admit: 2023-05-03 | Discharge: 2023-05-03 | Disposition: A | Discharge status: Home / Self Care | Source: Ambulatory Visit | Attending: Internal Medicine | Admitting: Internal Medicine

## 2023-05-03 DIAGNOSIS — D329 Benign neoplasm of meninges, unspecified: Secondary | ICD-10-CM | POA: Insufficient documentation

## 2023-05-03 MED ORDER — GADOBUTROL 1 MMOL/ML IV SOLN
7.5000 mL | Freq: Once | INTRAVENOUS | Status: AC | PRN
  Administered 2023-05-03: 7.5 mL via INTRAVENOUS

## 2023-05-05 ENCOUNTER — Inpatient Hospital Stay: Payer: 59 | Attending: Internal Medicine | Admitting: Internal Medicine

## 2023-05-05 VITALS — BP 149/104 | HR 93 | Temp 98.1°F | Resp 17 | Wt 173.0 lb

## 2023-05-05 DIAGNOSIS — D751 Secondary polycythemia: Secondary | ICD-10-CM | POA: Diagnosis present

## 2023-05-05 DIAGNOSIS — R569 Unspecified convulsions: Secondary | ICD-10-CM | POA: Diagnosis not present

## 2023-05-05 DIAGNOSIS — F17210 Nicotine dependence, cigarettes, uncomplicated: Secondary | ICD-10-CM | POA: Diagnosis not present

## 2023-05-05 DIAGNOSIS — Z79899 Other long term (current) drug therapy: Secondary | ICD-10-CM | POA: Insufficient documentation

## 2023-05-05 DIAGNOSIS — D320 Benign neoplasm of cerebral meninges: Secondary | ICD-10-CM | POA: Diagnosis not present

## 2023-05-05 DIAGNOSIS — D329 Benign neoplasm of meninges, unspecified: Secondary | ICD-10-CM | POA: Diagnosis not present

## 2023-05-05 DIAGNOSIS — R030 Elevated blood-pressure reading, without diagnosis of hypertension: Secondary | ICD-10-CM | POA: Diagnosis not present

## 2023-05-05 NOTE — Progress Notes (Signed)
 [facility] at [facility] [address]  [place], [place] [address] [phone]   Interval Evaluation  Date of Service: 05/05/23 Patient Name: Summer King Patient MRN: [id number] Patient DOB: December 09, 1980 Provider: Henreitta Leber, MD  Identifying Statement:  Summer King is a 43 y.o. female with right frontal meningioma   Oncologic History 10/30/21: Craniotomy, resection of R parietal mass ([facility]).  Path is WHO 1 meningioma  Interval History: Summer King presents today for follow up, after recent MRI brain.  She has been doing well without further changes.  Denies seizures, continues on the reduced dose Keppra 250mg  twice per day.  H+P (10/03/21) presented to medical attention this past month, with complaint of "woke up with left arm weakness and confusion".  She had gone to bed the night before in normal state of health.  Upon awakening, she could not use her left hand and was "staring and foggy" for several hours.  Returned to baseline by the afternoon.  CNS imaging demonstrated enhancing dural based right parietal mass.  She was discharged from ED with decadron which has now been stopped.  Today complains of anxiety, no further seizures.  Medications: Current Outpatient Medications on File Prior to Visit  Medication Sig Dispense Refill   carvedilol (COREG) 25 MG tablet 1 tablet with food Orally Twice a day for 30 days     levETIRAcetam (KEPPRA) 250 MG tablet TAKE 1 TABLET(250 MG) BY MOUTH TWICE DAILY 120 tablet 2   levonorgestrel (MIRENA) 20 MCG/24HR IUD 1 each by Intrauterine route once.     lisinopril (ZESTRIL) 10 MG tablet Take 10 mg by mouth daily.     lidocaine (LIDODERM) 5 % Place 1 patch onto the skin daily. Remove & Discard patch within 12 hours or as directed by MD 30 patch 0   oxyCODONE-acetaminophen (PERCOCET/ROXICET) 5-325 MG tablet Take 1 tablet by mouth every 6 (six) hours as needed for severe pain. 15 tablet 0   No current  facility-administered medications on file prior to visit.    Allergies:  Allergies  Allergen Reactions   Codeine Hives   Erythromycin Hives   Griseofulvin Ultramicrosize [Griseofulvin] Hives    tramadol   Hydrocodone Hives   Penicillins    Scopolamine Hives   Tramadol Hives and Itching   Past Medical History:  Past Medical History:  Diagnosis Date   Hypertension    Past Surgical History:  Past Surgical History:  Procedure Laterality Date   APPLICATION OF CRANIAL NAVIGATION Right 10/30/2021   Procedure: APPLICATION OF CRANIAL NAVIGATION;  Surgeon: Lisbeth Renshaw, MD;  [facility];  Service: Neurosurgery;  Laterality: Right;   CRANIOTOMY Right 10/30/2021   Procedure: STEREOTACTIC RT, PARIETAL CRANI FOR RESECTION OF TUMOR;  Surgeon: Lisbeth Renshaw, MD;  Location[facility];  Service: Neurosurgery;  Laterality: Right;   MANDIBLE FRACTURE SURGERY  4th grade   WISDOM TOOTH EXTRACTION     Social History:  Social History   Socioeconomic History   Marital status: Married    Spouse name: Not on file   Number of children: 1   Years of education: 12+   Highest education level: Not on file  Occupational History   Occupation: computer/office work  Tobacco Use   Smoking status: Every Day    Types: Cigarettes   Smokeless tobacco: Current   Tobacco comments:    contemplating, interested in Chantix (04/2015)  Vaping Use   Vaping status: Some Days  Substance and Sexual Activity   Alcohol use:  Yes    Alcohol/week: 2.0 - 3.0 standard drinks of alcohol    Types: 2 - 3 Standard drinks or equivalent per week   Drug use: No   Sexual activity: Yes  Other Topics Concern   Not on file  Social History Narrative   Lives with her husband and son.   Social Drivers of Corporate investment banker Strain: Not on file  Food Insecurity: Not on file  Transportation Needs: Not on file  Physical Activity: Not on file  Stress: Not on file  Social Connections: Not on file  Intimate  Partner Violence: Not on file   Family History:  Family History  Problem Relation Age of Onset   Heart disease Father    Arthritis Mother        rheumatoid    Review of Systems: Constitutional: Doesn't report fevers, chills or abnormal weight loss Eyes: Doesn't report blurriness of vision Ears, nose, mouth, throat, and face: Doesn't report sore throat Respiratory: Doesn't report cough, dyspnea or wheezes Cardiovascular: Doesn't report palpitation, chest discomfort  Gastrointestinal:  Doesn't report nausea, constipation, diarrhea GU: Doesn't report incontinence Skin: Doesn't report skin rashes Neurological: Per HPI Musculoskeletal: Doesn't report joint pain Behavioral/Psych: Doesn't report anxiety  Physical Exam: Vitals:   05/05/23 1200 05/05/23 1201  BP: (!) 167/111 (!) 149/104  Pulse: 93   Resp: 17   Temp: 98.1 F (36.7 C)   SpO2: 97%    KPS: 90. General: Alert, cooperative, pleasant, in no acute distress Head: Normal EENT: No conjunctival injection or scleral icterus.  Lungs: Resp effort normal Cardiac: Regular rate Abdomen: Non-distended abdomen Skin: No rashes cyanosis or petechiae. Extremities: No clubbing or edema  Neurologic Exam: Mental Status: Awake, alert, attentive to examiner. Oriented to self and environment. Language is fluent with intact comprehension.  Cranial Nerves: Visual acuity is grossly normal. Visual fields are full. Extra-ocular movements intact. No ptosis. Face is symmetric Motor: Tone and bulk are normal. Power is full in both arms and legs. Reflexes are symmetric, no pathologic reflexes present.  Sensory: Intact to light touch Gait: Normal.   Labs: I have reviewed the data as listed    Component Value Date/Time   NA 136 10/23/2021 1112   K 4.2 10/23/2021 1112   CL 106 10/23/2021 1112   CO2 22 10/23/2021 1112   GLUCOSE 108 (H) 10/23/2021 1112   BUN 9 10/23/2021 1112   CREATININE 0.71 10/23/2021 1112   CALCIUM 9.2 10/23/2021 1112    PROT 7.8 09/22/2021 0919   ALBUMIN 3.9 09/22/2021 0919   AST 31 09/22/2021 0919   ALT 30 09/22/2021 0919   ALKPHOS 123 09/22/2021 0919   BILITOT 0.3 09/22/2021 0919   GFRNONAA >60 10/23/2021 1112   GFRAA  11/25/2008 0753    >60        The eGFR has been calculated using the MDRD equation. This calculation has not been validated in all clinical situations. eGFR's persistently <60 mL/min signify possible Chronic Kidney Disease.   Lab Results  Component Value Date   WBC 9.1 10/23/2021   NEUTROABS 15.1 (H) 09/25/2021   HGB 16.1 (H) 10/23/2021   HCT 45.5 10/23/2021   MCV 100.2 (H) 10/23/2021   PLT 347 10/23/2021   Imaging:  [facility] Clinician Interpretation: I have personally reviewed the CNS images as listed.  My interpretation, in the context of the patient's clinical presentation, is stable disease  MR BRAIN W WO CONTRAST Result Date: 05/03/2023 CLINICAL DATA:  43 year old female with a history  of treated right parietal meningioma. Restaging EXAM: MRI HEAD WITHOUT AND WITH CONTRAST TECHNIQUE: Multiplanar, multiecho pulse sequences of the brain and surrounding structures were obtained without and with intravenous contrast. CONTRAST:  7.26mL GADAVIST GADOBUTROL 1 MMOL/ML IV SOLN COMPARISON:  Brain MRI 04/29/2022 and earlier. FINDINGS: Brain: Chronic right superior parietal craniotomy. Underlying T2 and FLAIR hyperintensity, encephalomalacia remains stable. Vascular related enhancement in the region is unchanged and there is no masslike enhancement or dural thickening. Background brain volume remains normal. No superimposed restricted diffusion to suggest acute infarction. No midline shift, mass effect, ventriculomegaly, or acute intracranial hemorrhage. Cervicomedullary junction and pituitary are within normal limits. Wallace Cullens and white matter signal elsewhere is stable with scattered small nonspecific white matter T2 and FLAIR hyperintensity. No other cortical encephalomalacia. No significant  chronic cerebral blood products. Vascular: Major intracranial vascular flow voids are stable. Following contrast the major dural venous sinuses are enhancing and appear patent. Skull and upper cervical spine: Previous craniotomy. Normal background bone marrow signal. Negative visible cervical spine and spinal cord. Sinuses/Orbits: Stable and negative. Other: Visible internal auditory structures appear normal. Stable scalp soft tissues. IMPRESSION: Satisfactory post treatment appearance of the Brain with no evidence of residual or recurrent meningioma. No acute intracranial abnormality. Electronically Signed   By: Odessa Fleming M.D.   On: 05/03/2023 10:48    Assessment/Plan Meningioma (HCC)  Focal seizure (HCC)  Summer King is clinically stable today.  MRI demonstrates stable findings overall.  Will con't Keppra 250mg  given long term seizure freedom, per patient preference.    She will reach out to her PCP regarding the hypertension.  We ask that Summer King return to clinic in 12 months with MRI brain for review, or sooner as needed.  All questions were answered. The patient knows to call the clinic with any problems, questions or concerns. No barriers to learning were detected.  The total time spent in the encounter was 30 minutes and more than 50% was on counseling and review of test results   Henreitta Leber, MD Medical Director of Neuro-Oncology [facility] 05/05/23 11:53 AM

## 2023-05-06 ENCOUNTER — Telehealth: Payer: Self-pay | Admitting: Internal Medicine

## 2023-05-06 NOTE — Telephone Encounter (Signed)
 Patient scheduled appointments. Patient is aware of all appointment details.

## 2023-05-12 ENCOUNTER — Encounter: Payer: Self-pay | Admitting: Hematology and Oncology

## 2023-05-12 ENCOUNTER — Inpatient Hospital Stay

## 2023-05-12 ENCOUNTER — Inpatient Hospital Stay (HOSPITAL_BASED_OUTPATIENT_CLINIC_OR_DEPARTMENT_OTHER): Admitting: Hematology and Oncology

## 2023-05-12 VITALS — BP 152/110 | HR 98 | Temp 98.9°F | Resp 18 | Ht 63.0 in | Wt 175.6 lb

## 2023-05-12 VITALS — BP 148/104 | HR 93

## 2023-05-12 DIAGNOSIS — R030 Elevated blood-pressure reading, without diagnosis of hypertension: Secondary | ICD-10-CM | POA: Diagnosis not present

## 2023-05-12 DIAGNOSIS — D751 Secondary polycythemia: Secondary | ICD-10-CM

## 2023-05-12 DIAGNOSIS — Z720 Tobacco use: Secondary | ICD-10-CM

## 2023-05-12 LAB — CBC WITH DIFFERENTIAL/PLATELET
Abs Immature Granulocytes: 0.03 10*3/uL (ref 0.00–0.07)
Basophils Absolute: 0.1 10*3/uL (ref 0.0–0.1)
Basophils Relative: 1 %
Eosinophils Absolute: 0.2 10*3/uL (ref 0.0–0.5)
Eosinophils Relative: 3 %
HCT: 51.3 % — ABNORMAL HIGH (ref 36.0–46.0)
Hemoglobin: 18.6 g/dL — ABNORMAL HIGH (ref 12.0–15.0)
Immature Granulocytes: 0 %
Lymphocytes Relative: 24 %
Lymphs Abs: 2 10*3/uL (ref 0.7–4.0)
MCH: 35.4 pg — ABNORMAL HIGH (ref 26.0–34.0)
MCHC: 36.3 g/dL — ABNORMAL HIGH (ref 30.0–36.0)
MCV: 97.7 fL (ref 80.0–100.0)
Monocytes Absolute: 0.7 10*3/uL (ref 0.1–1.0)
Monocytes Relative: 9 %
Neutro Abs: 5.2 10*3/uL (ref 1.7–7.7)
Neutrophils Relative %: 63 %
Platelets: 217 10*3/uL (ref 150–400)
RBC: 5.25 MIL/uL — ABNORMAL HIGH (ref 3.87–5.11)
RDW: 15.6 % — ABNORMAL HIGH (ref 11.5–15.5)
WBC: 8.3 10*3/uL (ref 4.0–10.5)
nRBC: 0 % (ref 0.0–0.2)

## 2023-05-12 NOTE — Patient Instructions (Signed)

## 2023-05-12 NOTE — Progress Notes (Signed)
 Summer King presents today for phlebotomy per MD orders. Phlebotomy procedure started at 1501 and ended at 1507. 509 grams removed. Patient agreed to 15 minute observation. Patient tolerated procedure well without incident. IV needle removed intact. VSS at discharge.  Ambulated to lobby.

## 2023-05-12 NOTE — Assessment & Plan Note (Addendum)
We discussed importance of nicotine cessation ?

## 2023-05-12 NOTE — Assessment & Plan Note (Addendum)
 The patient was seen several years ago for secondary polycythemia due to significant cigarette smoking Unfortunately, she is still smoking approximately a pack of cigarettes per day She recently tried to donate blood but was turned away because her hemoglobin on fingerstick exam was 20.2 I repeated CBC today and her hemoglobin came back lower but still very high I recommend phlebotomy today and she agreed After that, she will return to blood bank for blood donation every other month She will call me again in the future if she needs phlebotomy again

## 2023-05-12 NOTE — Progress Notes (Signed)
 [facility] OFFICE PROGRESS NOTE  Patient Care Team: Irven Coe, MD as PCP - General (Family Medicine)  Assessment & Plan Polycythemia, secondary The patient was seen several years ago for secondary polycythemia due to significant cigarette smoking Unfortunately, she is still smoking approximately a pack of cigarettes per day She recently tried to donate blood but was turned away because her hemoglobin on fingerstick exam was 20.2 I repeated CBC today and her hemoglobin came back lower but still very high I recommend phlebotomy today and she agreed After that, she will return to blood bank for blood donation every other month She will call me again in the future if she needs phlebotomy again Tobacco abuse We discussed importance of nicotine cessation Elevated BP without diagnosis of hypertension Likely due to secondary polycythemia I recommend phlebotomy today  Orders Placed This Encounter  Procedures   CBC with Differential/Platelet    Standing Status:   Standing    Number of Occurrences:   22    Expiration Date:   05/11/2024     Artis Delay, MD  INTERVAL HISTORY: she returns for urgent evaluation She was last seen in 2023 and had phlebotomy due to secondary polycythemia from smoking She was diagnosed with meningioma and is currently on active observation with neuro oncologist She is still smoking approximately 1 pack of cigarettes per day She is not able to quit yet She has been donating blood intermittently over the past few years but was recently turned away from blood donation last month when her hemoglobin was 20.2 Her blood pressure is high but she denies headache No recent diagnosis of blood clot  PHYSICAL EXAMINATION: ECOG PERFORMANCE STATUS: 1 - Symptomatic but completely ambulatory  Vitals:   05/12/23 1353  BP: (!) 152/110  Pulse: 98  Resp: 18  Temp: 98.9 F (37.2 C)  SpO2: 97%   Filed Weights   05/12/23 1353  Weight: 175 lb 9.6 oz (79.7  kg)    Relevant data reviewed during this visit included CBC and recent MRI from April 2025

## 2023-05-12 NOTE — Assessment & Plan Note (Addendum)
 Likely due to secondary polycythemia I recommend phlebotomy today

## 2023-08-12 ENCOUNTER — Other Ambulatory Visit: Payer: Self-pay | Admitting: Internal Medicine

## 2024-02-08 ENCOUNTER — Other Ambulatory Visit: Payer: Self-pay | Admitting: Internal Medicine

## 2024-03-05 ENCOUNTER — Inpatient Hospital Stay: Admitting: Hematology and Oncology

## 2024-03-08 ENCOUNTER — Inpatient Hospital Stay

## 2024-03-08 ENCOUNTER — Inpatient Hospital Stay: Admitting: Hematology and Oncology

## 2024-03-08 ENCOUNTER — Other Ambulatory Visit: Payer: Self-pay | Admitting: Hematology and Oncology

## 2024-03-08 VITALS — BP 153/109 | HR 78 | Resp 16

## 2024-03-08 VITALS — BP 152/106 | HR 94 | Temp 99.2°F | Resp 18 | Ht 63.0 in | Wt 174.6 lb

## 2024-03-08 DIAGNOSIS — Z720 Tobacco use: Secondary | ICD-10-CM

## 2024-03-08 DIAGNOSIS — D751 Secondary polycythemia: Secondary | ICD-10-CM

## 2024-03-08 DIAGNOSIS — R030 Elevated blood-pressure reading, without diagnosis of hypertension: Secondary | ICD-10-CM

## 2024-03-08 LAB — CBC WITH DIFFERENTIAL/PLATELET
Abs Immature Granulocytes: 0.03 10*3/uL (ref 0.00–0.07)
Basophils Absolute: 0.2 10*3/uL — ABNORMAL HIGH (ref 0.0–0.1)
Basophils Relative: 2 %
Eosinophils Absolute: 0.2 10*3/uL (ref 0.0–0.5)
Eosinophils Relative: 2 %
HCT: 56.7 % — ABNORMAL HIGH (ref 36.0–46.0)
Hemoglobin: 20.1 g/dL — ABNORMAL HIGH (ref 12.0–15.0)
Immature Granulocytes: 0 %
Lymphocytes Relative: 22 %
Lymphs Abs: 2.2 10*3/uL (ref 0.7–4.0)
MCH: 35.3 pg — ABNORMAL HIGH (ref 26.0–34.0)
MCHC: 35.4 g/dL (ref 30.0–36.0)
MCV: 99.5 fL (ref 80.0–100.0)
Monocytes Absolute: 0.9 10*3/uL (ref 0.1–1.0)
Monocytes Relative: 9 %
Neutro Abs: 6.4 10*3/uL (ref 1.7–7.7)
Neutrophils Relative %: 65 %
Platelets: 240 10*3/uL (ref 150–400)
RBC: 5.7 MIL/uL — ABNORMAL HIGH (ref 3.87–5.11)
RDW: 15.1 % (ref 11.5–15.5)
WBC: 9.8 10*3/uL (ref 4.0–10.5)
nRBC: 0 % (ref 0.0–0.2)

## 2024-03-08 NOTE — Progress Notes (Signed)
 Total volume removed 379cc for 2nd stick. Line clotted at 1715. Patient declined to be stuck again.

## 2024-03-08 NOTE — Progress Notes (Signed)
 Summer King presents today for phlebotomy per MD orders. Phlebotomy procedure started at 1546 and ended at 1551.16 G phlebotomy kit utilized to the [facility]. Site clotted off after 42 grams removed. PIV removed. A second site established at 1600. An 7 G PIV w/ secondary line utilized for procedure.  Patient provided with snack and beverages.  This RN gave handoff to [employer] and [employer] prior to the completion of phlebotomy.

## 2024-03-08 NOTE — Assessment & Plan Note (Addendum)
 The patient was seen several years ago for secondary polycythemia due to significant cigarette smoking Unfortunately, she is still smoking approximately a pack of cigarettes per day The patient is not able to quit smoking and has not started Wellbutrin that was prescribed by his primary care doctor  I repeated CBC today and her hemoglobin came back very high I recommend phlebotomy today and she agreed I also recommend aspirin therapy due to high risk of blood clot I recommend 3 more phlebotomy session to get her hemoglobin down so that she can resume blood donation every other month in the future She will call me again in the future if she needs phlebotomy again

## 2024-03-09 ENCOUNTER — Encounter: Payer: Self-pay | Admitting: Hematology and Oncology

## 2024-03-09 NOTE — Assessment & Plan Note (Addendum)
 We discussed importance of nicotine cessation

## 2024-03-09 NOTE — Progress Notes (Signed)
 [facility] OFFICE PROGRESS NOTE  Patient Care Team: Leonel Cole, MD as PCP - General (Family Medicine)  Assessment & Plan Polycythemia, secondary The patient was seen several years ago for secondary polycythemia due to significant cigarette smoking Unfortunately, she is still smoking approximately a pack of cigarettes per day The patient is not able to quit smoking and has not started Wellbutrin that was prescribed by his primary care doctor  I repeated CBC today and her hemoglobin came back very high I recommend phlebotomy today and she agreed I also recommend aspirin therapy due to high risk of blood clot I recommend 3 more phlebotomy session to get her hemoglobin down so that she can resume blood donation every other month in the future She will call me again in the future if she needs phlebotomy again  Elevated BP without diagnosis of hypertension Likely due to secondary polycythemia I recommend phlebotomy today Tobacco abuse We discussed importance of nicotine cessation  No orders of the defined types were placed in this encounter.    Summer Bedford, MD  INTERVAL HISTORY: she returns for surveillance follow-up due to a secondary polycythemia from smoking She has not seen me for some time She stopped donating blood a while back In September 2025, her hemoglobin was 20 when she saw her primary care doctor She was refused blood donation due to high hemoglobin She could not give me a reason why she waited this long to see me today She is still smoking She denies headache despite elevated blood pressure  PHYSICAL EXAMINATION: ECOG PERFORMANCE STATUS: 0 - Asymptomatic  Vitals:   03/08/24 1501  BP: (!) 152/106  Pulse: 94  Resp: 18  Temp: 99.2 F (37.3 C)  SpO2: 96%   Filed Weights   03/08/24 1501  Weight: 174 lb 9.6 oz (79.2 kg)    Relevant data reviewed during this visit included CBC

## 2024-03-09 NOTE — Assessment & Plan Note (Addendum)
 Likely due to secondary polycythemia I recommend phlebotomy today

## 2024-03-15 ENCOUNTER — Inpatient Hospital Stay

## 2024-03-22 ENCOUNTER — Inpatient Hospital Stay

## 2024-03-29 ENCOUNTER — Inpatient Hospital Stay

## 2024-05-03 ENCOUNTER — Ambulatory Visit: Admitting: Internal Medicine
